# Patient Record
Sex: Male | Born: 1935 | Race: White | Hispanic: No | Marital: Married | Smoking: Never smoker
Health system: Southern US, Community
[De-identification: ages and names within clinical notes are randomized; demographics above are authoritative.]

## PROBLEM LIST (undated history)

## (undated) DIAGNOSIS — D496 Neoplasm of unspecified behavior of brain: Secondary | ICD-10-CM

## (undated) DIAGNOSIS — E785 Hyperlipidemia, unspecified: Secondary | ICD-10-CM

## (undated) DIAGNOSIS — G459 Transient cerebral ischemic attack, unspecified: Secondary | ICD-10-CM

## (undated) DIAGNOSIS — H269 Unspecified cataract: Secondary | ICD-10-CM

## (undated) DIAGNOSIS — C61 Malignant neoplasm of prostate: Secondary | ICD-10-CM

## (undated) DIAGNOSIS — I10 Essential (primary) hypertension: Secondary | ICD-10-CM

## (undated) DIAGNOSIS — Z889 Allergy status to unspecified drugs, medicaments and biological substances status: Secondary | ICD-10-CM

## (undated) DIAGNOSIS — N390 Urinary tract infection, site not specified: Secondary | ICD-10-CM

## (undated) DIAGNOSIS — R319 Hematuria, unspecified: Secondary | ICD-10-CM

## (undated) DIAGNOSIS — H9190 Unspecified hearing loss, unspecified ear: Secondary | ICD-10-CM

## (undated) HISTORY — PX: MELANOMA EXCISION: SHX5266

---

## 1997-09-30 ENCOUNTER — Other Ambulatory Visit: Admission: RE | Admit: 1997-09-30 | Discharge: 1997-09-30 | Payer: Self-pay | Admitting: Urology

## 1997-10-14 ENCOUNTER — Encounter: Admission: RE | Admit: 1997-10-14 | Discharge: 1998-01-12 | Payer: Self-pay | Admitting: Radiation Oncology

## 1998-10-24 ENCOUNTER — Ambulatory Visit: Admission: RE | Admit: 1998-10-24 | Discharge: 1998-10-24 | Payer: Self-pay | Admitting: *Deleted

## 2000-07-31 ENCOUNTER — Ambulatory Visit (HOSPITAL_COMMUNITY): Admission: RE | Admit: 2000-07-31 | Discharge: 2000-07-31 | Payer: Self-pay | Admitting: Gastroenterology

## 2001-06-13 ENCOUNTER — Ambulatory Visit (HOSPITAL_COMMUNITY): Admission: RE | Admit: 2001-06-13 | Discharge: 2001-06-13 | Payer: Self-pay | Admitting: *Deleted

## 2001-06-13 ENCOUNTER — Encounter: Payer: Self-pay | Admitting: *Deleted

## 2001-06-21 ENCOUNTER — Ambulatory Visit (HOSPITAL_COMMUNITY): Admission: RE | Admit: 2001-06-21 | Discharge: 2001-06-21 | Payer: Self-pay | Admitting: *Deleted

## 2001-06-21 ENCOUNTER — Encounter: Payer: Self-pay | Admitting: *Deleted

## 2005-06-04 ENCOUNTER — Ambulatory Visit: Payer: Self-pay | Admitting: Gastroenterology

## 2005-06-27 ENCOUNTER — Ambulatory Visit: Payer: Self-pay | Admitting: Gastroenterology

## 2005-07-10 ENCOUNTER — Ambulatory Visit (HOSPITAL_COMMUNITY): Admission: RE | Admit: 2005-07-10 | Discharge: 2005-07-10 | Payer: Self-pay | Admitting: Gastroenterology

## 2005-07-10 ENCOUNTER — Ambulatory Visit: Payer: Self-pay | Admitting: Oncology

## 2005-07-10 ENCOUNTER — Ambulatory Visit: Payer: Self-pay | Admitting: Gastroenterology

## 2005-07-12 ENCOUNTER — Ambulatory Visit (HOSPITAL_COMMUNITY): Admission: RE | Admit: 2005-07-12 | Discharge: 2005-07-12 | Payer: Self-pay | Admitting: General Surgery

## 2005-07-23 ENCOUNTER — Ambulatory Visit (HOSPITAL_COMMUNITY): Admission: RE | Admit: 2005-07-23 | Discharge: 2005-07-24 | Payer: Self-pay | Admitting: General Surgery

## 2005-07-23 ENCOUNTER — Encounter (INDEPENDENT_AMBULATORY_CARE_PROVIDER_SITE_OTHER): Payer: Self-pay | Admitting: *Deleted

## 2005-12-10 ENCOUNTER — Ambulatory Visit: Payer: Self-pay | Admitting: Oncology

## 2005-12-12 LAB — CBC WITH DIFFERENTIAL/PLATELET
BASO%: 0.6 % (ref 0.0–2.0)
Eosinophils Absolute: 0.6 10*3/uL — ABNORMAL HIGH (ref 0.0–0.5)
LYMPH%: 29.5 % (ref 14.0–48.0)
MCHC: 34.1 g/dL (ref 32.0–35.9)
MCV: 92.9 fL (ref 81.6–98.0)
MONO%: 12 % (ref 0.0–13.0)
Platelets: 338 10*3/uL (ref 145–400)
RBC: 4.91 10*6/uL (ref 4.20–5.71)

## 2005-12-12 LAB — COMPREHENSIVE METABOLIC PANEL
Alkaline Phosphatase: 47 U/L (ref 39–117)
Creatinine, Ser: 1.1 mg/dL (ref 0.40–1.50)
Glucose, Bld: 98 mg/dL (ref 70–99)
Sodium: 137 mEq/L (ref 135–145)
Total Bilirubin: 0.6 mg/dL (ref 0.3–1.2)
Total Protein: 7.5 g/dL (ref 6.0–8.3)

## 2006-04-22 ENCOUNTER — Ambulatory Visit: Payer: Self-pay | Admitting: Oncology

## 2006-04-24 LAB — CBC WITH DIFFERENTIAL/PLATELET
BASO%: 0.8 % (ref 0.0–2.0)
HCT: 44.3 % (ref 38.7–49.9)
LYMPH%: 32.2 % (ref 14.0–48.0)
MCHC: 34.5 g/dL (ref 32.0–35.9)
MCV: 93.2 fL (ref 81.6–98.0)
MONO#: 0.7 10*3/uL (ref 0.1–0.9)
MONO%: 11.1 % (ref 0.0–13.0)
NEUT%: 46.2 % (ref 40.0–75.0)
Platelets: 320 10*3/uL (ref 145–400)
RBC: 4.74 10*6/uL (ref 4.20–5.71)
WBC: 6.5 10*3/uL (ref 4.0–10.0)

## 2006-04-24 LAB — COMPREHENSIVE METABOLIC PANEL
AST: 21 U/L (ref 0–37)
Albumin: 4.5 g/dL (ref 3.5–5.2)
Alkaline Phosphatase: 45 U/L (ref 39–117)
Potassium: 4.2 mEq/L (ref 3.5–5.3)
Sodium: 140 mEq/L (ref 135–145)
Total Protein: 7.3 g/dL (ref 6.0–8.3)

## 2006-10-15 ENCOUNTER — Ambulatory Visit: Payer: Self-pay | Admitting: Oncology

## 2006-10-17 LAB — CBC WITH DIFFERENTIAL/PLATELET
BASO%: 0.7 % (ref 0.0–2.0)
Basophils Absolute: 0 10*3/uL (ref 0.0–0.1)
EOS%: 9.2 % — ABNORMAL HIGH (ref 0.0–7.0)
HCT: 42.9 % (ref 38.7–49.9)
HGB: 15 g/dL (ref 13.0–17.1)
LYMPH%: 30.2 % (ref 14.0–48.0)
MCH: 32.1 pg (ref 28.0–33.4)
MCHC: 35 g/dL (ref 32.0–35.9)
MCV: 91.7 fL (ref 81.6–98.0)
MONO%: 11.7 % (ref 0.0–13.0)
NEUT%: 48.2 % (ref 40.0–75.0)

## 2006-10-17 LAB — COMPREHENSIVE METABOLIC PANEL
AST: 21 U/L (ref 0–37)
Alkaline Phosphatase: 50 U/L (ref 39–117)
BUN: 14 mg/dL (ref 6–23)
Calcium: 9.1 mg/dL (ref 8.4–10.5)
Creatinine, Ser: 0.92 mg/dL (ref 0.40–1.50)
Total Bilirubin: 0.8 mg/dL (ref 0.3–1.2)

## 2007-04-11 ENCOUNTER — Ambulatory Visit: Payer: Self-pay | Admitting: Oncology

## 2007-04-15 LAB — CBC WITH DIFFERENTIAL/PLATELET
Basophils Absolute: 0 10*3/uL (ref 0.0–0.1)
Eosinophils Absolute: 0.8 10*3/uL — ABNORMAL HIGH (ref 0.0–0.5)
HCT: 43.3 % (ref 38.7–49.9)
HGB: 15.1 g/dL (ref 13.0–17.1)
MCV: 92.1 fL (ref 81.6–98.0)
MONO%: 11.4 % (ref 0.0–13.0)
NEUT#: 4.5 10*3/uL (ref 1.5–6.5)
NEUT%: 50.4 % (ref 40.0–75.0)
Platelets: 289 10*3/uL (ref 145–400)
RDW: 12.9 % (ref 11.2–14.6)

## 2007-04-15 LAB — COMPREHENSIVE METABOLIC PANEL
Albumin: 4.5 g/dL (ref 3.5–5.2)
Alkaline Phosphatase: 50 U/L (ref 39–117)
BUN: 15 mg/dL (ref 6–23)
Calcium: 9.7 mg/dL (ref 8.4–10.5)
Chloride: 107 mEq/L (ref 96–112)
Glucose, Bld: 100 mg/dL — ABNORMAL HIGH (ref 70–99)
Potassium: 4 mEq/L (ref 3.5–5.3)

## 2007-10-07 ENCOUNTER — Ambulatory Visit (HOSPITAL_COMMUNITY): Admission: RE | Admit: 2007-10-07 | Discharge: 2007-10-07 | Payer: Self-pay | Admitting: Oncology

## 2007-10-09 ENCOUNTER — Ambulatory Visit: Payer: Self-pay | Admitting: Oncology

## 2007-10-14 LAB — COMPREHENSIVE METABOLIC PANEL
ALT: 17 U/L (ref 0–53)
AST: 20 U/L (ref 0–37)
Albumin: 3.8 g/dL (ref 3.5–5.2)
Alkaline Phosphatase: 44 U/L (ref 39–117)
Potassium: 3.9 mEq/L (ref 3.5–5.3)
Sodium: 142 mEq/L (ref 135–145)
Total Bilirubin: 0.5 mg/dL (ref 0.3–1.2)
Total Protein: 6.7 g/dL (ref 6.0–8.3)

## 2007-10-14 LAB — CBC WITH DIFFERENTIAL/PLATELET
BASO%: 0.3 % (ref 0.0–2.0)
EOS%: 9.2 % — ABNORMAL HIGH (ref 0.0–7.0)
Eosinophils Absolute: 0.7 10*3/uL — ABNORMAL HIGH (ref 0.0–0.5)
LYMPH%: 20.2 % (ref 14.0–48.0)
MCHC: 35 g/dL (ref 32.0–35.9)
MCV: 91.5 fL (ref 81.6–98.0)
MONO%: 11.8 % (ref 0.0–13.0)
NEUT#: 4.1 10*3/uL (ref 1.5–6.5)
Platelets: 263 10*3/uL (ref 145–400)
RBC: 4.56 10*6/uL (ref 4.20–5.71)
RDW: 12.9 % (ref 11.2–14.6)

## 2008-04-12 ENCOUNTER — Ambulatory Visit: Payer: Self-pay | Admitting: Oncology

## 2008-05-13 LAB — COMPREHENSIVE METABOLIC PANEL
AST: 19 U/L (ref 0–37)
Albumin: 4 g/dL (ref 3.5–5.2)
Alkaline Phosphatase: 57 U/L (ref 39–117)
BUN: 12 mg/dL (ref 6–23)
Calcium: 9.6 mg/dL (ref 8.4–10.5)
Chloride: 108 mEq/L (ref 96–112)
Glucose, Bld: 154 mg/dL — ABNORMAL HIGH (ref 70–99)
Potassium: 4.1 mEq/L (ref 3.5–5.3)
Sodium: 141 mEq/L (ref 135–145)
Total Protein: 7.1 g/dL (ref 6.0–8.3)

## 2008-05-13 LAB — CBC WITH DIFFERENTIAL/PLATELET
Basophils Absolute: 0 10*3/uL (ref 0.0–0.1)
EOS%: 6.6 % (ref 0.0–7.0)
Eosinophils Absolute: 0.6 10*3/uL — ABNORMAL HIGH (ref 0.0–0.5)
HGB: 15.1 g/dL (ref 13.0–17.1)
NEUT#: 5.5 10*3/uL (ref 1.5–6.5)
RBC: 4.74 10*6/uL (ref 4.20–5.71)
RDW: 12.9 % (ref 11.2–14.6)
lymph#: 1.9 10*3/uL (ref 0.9–3.3)

## 2008-11-09 ENCOUNTER — Ambulatory Visit: Payer: Self-pay | Admitting: Oncology

## 2008-11-30 ENCOUNTER — Ambulatory Visit (HOSPITAL_COMMUNITY): Admission: RE | Admit: 2008-11-30 | Discharge: 2008-11-30 | Payer: Self-pay | Admitting: Oncology

## 2008-11-30 LAB — CBC WITH DIFFERENTIAL/PLATELET
Basophils Absolute: 0.1 10*3/uL (ref 0.0–0.1)
Eosinophils Absolute: 0.8 10*3/uL — ABNORMAL HIGH (ref 0.0–0.5)
HCT: 43.9 % (ref 38.4–49.9)
HGB: 15.2 g/dL (ref 13.0–17.1)
MCH: 32.3 pg (ref 27.2–33.4)
MONO#: 0.7 10*3/uL (ref 0.1–0.9)
NEUT#: 4.3 10*3/uL (ref 1.5–6.5)
NEUT%: 54.1 % (ref 39.0–75.0)
WBC: 8 10*3/uL (ref 4.0–10.3)
lymph#: 2.2 10*3/uL (ref 0.9–3.3)

## 2008-11-30 LAB — COMPREHENSIVE METABOLIC PANEL
Albumin: 4.1 g/dL (ref 3.5–5.2)
BUN: 16 mg/dL (ref 6–23)
CO2: 23 mEq/L (ref 19–32)
Calcium: 9.5 mg/dL (ref 8.4–10.5)
Chloride: 107 mEq/L (ref 96–112)
Creatinine, Ser: 1.02 mg/dL (ref 0.40–1.50)
Glucose, Bld: 104 mg/dL — ABNORMAL HIGH (ref 70–99)

## 2008-11-30 LAB — LACTATE DEHYDROGENASE: LDH: 165 U/L (ref 94–250)

## 2008-12-08 ENCOUNTER — Ambulatory Visit (HOSPITAL_COMMUNITY): Admission: RE | Admit: 2008-12-08 | Discharge: 2008-12-08 | Payer: Self-pay | Admitting: Oncology

## 2009-04-01 ENCOUNTER — Ambulatory Visit: Payer: Self-pay | Admitting: Oncology

## 2009-04-05 LAB — CBC WITH DIFFERENTIAL/PLATELET
BASO%: 1.2 % (ref 0.0–2.0)
EOS%: 11.3 % — ABNORMAL HIGH (ref 0.0–7.0)
HCT: 44.3 % (ref 38.4–49.9)
LYMPH%: 23.7 % (ref 14.0–49.0)
MCH: 31.4 pg (ref 27.2–33.4)
MCHC: 33.2 g/dL (ref 32.0–36.0)
MONO#: 0.5 10*3/uL (ref 0.1–0.9)
NEUT%: 55.8 % (ref 39.0–75.0)
Platelets: 247 10*3/uL (ref 140–400)

## 2009-04-05 LAB — COMPREHENSIVE METABOLIC PANEL
AST: 18 U/L (ref 0–37)
BUN: 13 mg/dL (ref 6–23)
Calcium: 9 mg/dL (ref 8.4–10.5)
Chloride: 109 mEq/L (ref 96–112)
Creatinine, Ser: 1.02 mg/dL (ref 0.40–1.50)

## 2009-09-30 ENCOUNTER — Ambulatory Visit: Payer: Self-pay | Admitting: Oncology

## 2009-10-04 LAB — CBC WITH DIFFERENTIAL/PLATELET
Eosinophils Absolute: 1.1 10*3/uL — ABNORMAL HIGH (ref 0.0–0.5)
HGB: 15.1 g/dL (ref 13.0–17.1)
MCV: 93.8 fL (ref 79.3–98.0)
MONO%: 9.5 % (ref 0.0–14.0)
NEUT#: 4.8 10*3/uL (ref 1.5–6.5)
RBC: 4.8 10*6/uL (ref 4.20–5.82)
RDW: 13.1 % (ref 11.0–14.6)
WBC: 9.6 10*3/uL (ref 4.0–10.3)
lymph#: 2.7 10*3/uL (ref 0.9–3.3)

## 2009-10-04 LAB — COMPREHENSIVE METABOLIC PANEL
AST: 25 U/L (ref 0–37)
Albumin: 3.8 g/dL (ref 3.5–5.2)
Alkaline Phosphatase: 57 U/L (ref 39–117)
Calcium: 9.2 mg/dL (ref 8.4–10.5)
Chloride: 104 mEq/L (ref 96–112)
Glucose, Bld: 100 mg/dL — ABNORMAL HIGH (ref 70–99)
Potassium: 4.4 mEq/L (ref 3.5–5.3)
Sodium: 140 mEq/L (ref 135–145)
Total Protein: 6.7 g/dL (ref 6.0–8.3)

## 2010-04-04 ENCOUNTER — Ambulatory Visit: Payer: Self-pay | Admitting: Oncology

## 2010-04-06 LAB — COMPREHENSIVE METABOLIC PANEL
Albumin: 3.7 g/dL (ref 3.5–5.2)
BUN: 13 mg/dL (ref 6–23)
CO2: 22 mEq/L (ref 19–32)
Calcium: 9.1 mg/dL (ref 8.4–10.5)
Chloride: 107 mEq/L (ref 96–112)
Creatinine, Ser: 1.06 mg/dL (ref 0.40–1.50)
Glucose, Bld: 101 mg/dL — ABNORMAL HIGH (ref 70–99)
Potassium: 4 mEq/L (ref 3.5–5.3)

## 2010-04-06 LAB — CBC WITH DIFFERENTIAL/PLATELET
Basophils Absolute: 0 10*3/uL (ref 0.0–0.1)
Eosinophils Absolute: 0.7 10*3/uL — ABNORMAL HIGH (ref 0.0–0.5)
HCT: 42.4 % (ref 38.4–49.9)
HGB: 14.8 g/dL (ref 13.0–17.1)
NEUT#: 4.8 10*3/uL (ref 1.5–6.5)
NEUT%: 57.5 % (ref 39.0–75.0)
RDW: 12.9 % (ref 11.0–14.6)
lymph#: 2.1 10*3/uL (ref 0.9–3.3)

## 2010-04-06 LAB — LACTATE DEHYDROGENASE: LDH: 171 U/L (ref 94–250)

## 2010-07-23 ENCOUNTER — Encounter: Payer: Self-pay | Admitting: Dermatology

## 2010-10-05 ENCOUNTER — Other Ambulatory Visit: Payer: Self-pay | Admitting: Oncology

## 2010-10-05 ENCOUNTER — Encounter (HOSPITAL_BASED_OUTPATIENT_CLINIC_OR_DEPARTMENT_OTHER): Payer: Medicare Other | Admitting: Oncology

## 2010-10-05 ENCOUNTER — Ambulatory Visit (HOSPITAL_COMMUNITY)
Admission: RE | Admit: 2010-10-05 | Discharge: 2010-10-05 | Disposition: A | Discharge status: Home / Self Care | Payer: Medicare Other | Source: Ambulatory Visit | Attending: Oncology | Admitting: Oncology

## 2010-10-05 DIAGNOSIS — C439 Malignant melanoma of skin, unspecified: Secondary | ICD-10-CM

## 2010-10-05 DIAGNOSIS — C4359 Malignant melanoma of other part of trunk: Secondary | ICD-10-CM

## 2010-10-05 DIAGNOSIS — I10 Essential (primary) hypertension: Secondary | ICD-10-CM | POA: Insufficient documentation

## 2010-10-05 DIAGNOSIS — K8020 Calculus of gallbladder without cholecystitis without obstruction: Secondary | ICD-10-CM | POA: Insufficient documentation

## 2010-10-05 DIAGNOSIS — Z85820 Personal history of malignant melanoma of skin: Secondary | ICD-10-CM | POA: Insufficient documentation

## 2010-10-05 DIAGNOSIS — Z8546 Personal history of malignant neoplasm of prostate: Secondary | ICD-10-CM

## 2010-10-05 LAB — CBC WITH DIFFERENTIAL/PLATELET
BASO%: 0.9 % (ref 0.0–2.0)
LYMPH%: 30.8 % (ref 14.0–49.0)
MCHC: 34.1 g/dL (ref 32.0–36.0)
MCV: 91 fL (ref 79.3–98.0)
MONO%: 10.4 % (ref 0.0–14.0)
Platelets: 276 10*3/uL (ref 140–400)
RBC: 4.87 10*6/uL (ref 4.20–5.82)
WBC: 9 10*3/uL (ref 4.0–10.3)

## 2010-10-05 LAB — COMPREHENSIVE METABOLIC PANEL
ALT: 18 U/L (ref 0–53)
AST: 20 U/L (ref 0–37)
Alkaline Phosphatase: 53 U/L (ref 39–117)
Sodium: 140 mEq/L (ref 135–145)
Total Bilirubin: 0.4 mg/dL (ref 0.3–1.2)
Total Protein: 6.6 g/dL (ref 6.0–8.3)

## 2010-10-06 ENCOUNTER — Encounter (HOSPITAL_BASED_OUTPATIENT_CLINIC_OR_DEPARTMENT_OTHER): Payer: Medicare Other | Admitting: Oncology

## 2010-10-06 DIAGNOSIS — C4359 Malignant melanoma of other part of trunk: Secondary | ICD-10-CM

## 2010-10-06 DIAGNOSIS — Z8546 Personal history of malignant neoplasm of prostate: Secondary | ICD-10-CM

## 2010-11-17 NOTE — Op Note (Signed)
NAME:  Jesse Owen, Jesse Owen NO.:  [id number]   MEDICAL RECORD NO.:  [id number]          PATIENT TYPE:  OIB   LOCATION:  5711                         FACILITY:  [facility]   PHYSICIAN:  Gabrielle Dare. Janee [name], M.D.DATE OF BIRTH:  Apr 04, 1930   DATE OF PROCEDURE:  07/23/2005  DATE OF DISCHARGE:                                 OPERATIVE REPORT   PREOPERATIVE DIAGNOSIS:  Melanoma of the right central back.   POSTOPERATIVE DIAGNOSIS:  Melanoma of the right central back.   OPERATION PERFORMED:  1.  Left axillary sentinel lymph node biopsy x4.  2.  Wide excision of melanoma, right back, 15 x 7 cm.   COSURGEONS:  1.  Gabrielle Dare. Janee [name], M.D.  2.  Marcial Pacas E. Earlene Plater, M.D.   ANESTHESIA:  General.   INDICATIONS FOR PROCEDURE:  The patient is a 77 year old white male who was  evaluated in our office by Marcial Pacas E. Earlene Plater, M.D. in regard to a melanoma on  his right back.  Biopsy revealed it to be in the thick range of 5 mm total  thickness.  Lymph scintigraphy was obtained preoperatively which  demonstrated some scattered uptake but the most focused uptake was in the  posterior aspect of his axillary node base and he presents for left axillary  sentinel lymph node biopsy and wide excision of melanoma of his back.   DESCRIPTION OF PROCEDURE:  Informed consent was obtained.  The patient was  identified in the preoperative holding area.  His site was marked. He  received intravenous antibiotics and informed consent was obtained and he  was brought to the operating room. General anesthesia was administered.  Subsequently, the lesion on his back was prepped with alcohol wipes and  intradermal injection of 4 mL of methylene blue was accomplished without  difficulty.  That was injected in four quadrants around the lesion on his  back. That was massaged for four minutes by the clock.  He was returned to  his back and his left axilla was prepped and draped in sterile fashion.  We  then used a  NeoProbe for guidance and while the signal was not exceedingly  strong, the high signal was located in the mid to posterior portion of his  left axilla.  A transverse incision was made.  Subcutaneous tissues were  dissected down.  The axillary fat was entered.  Then using the NeoProbe as  guidance, an area of lymph tissue was identified with some streaky blue  lymphatics that was mildly active with radioactivity.  It was excised as the  first sentinel node.  There was some further signal down in the area and two  further small clusters of nodes with some small blue streaks were removed.  We stayed away from the long thoracic and thoracodorsal nerves.  They were  identified and avoided.  The NeoProbe signal although moderate, was not  exceedingly high in any of these nodal tissues so one further area of signal  was identified and this was removed, staying clear of the nerves'  structures.  Baby clips were used on the small veins and lymphatics with all  of  the samplings and the fourth sentinel lymph node area was sent to  pathology.  No other significant signal was found with the NeoProbe.  The  wound was copiously irrigated.  Meticulous hemostasis was ensured. It was  then closed in two layers and the subcutaneous tissues approximated with 3-0  Vicryl suture in an interrupted.  The skin was closed with a running 4-0  Monocryl subcuticular stitch.  Benzoin and Steri-Strips and sterile dressing  were applied.  We then changed gowns and instruments.  The patient was  flipped into prone position.  Measurements around his lesion  and  examination of his skin seemed consistent with a good ability to get a  greater than 3 cm margin and achieve closure.  His back was prepped and  draped in sterile fashion.  Some 0.25% Marcaine with epinephrine was  injected with local anesthetic.  A long elliptical incision was measured in  order to give a 3 cm or greater margin circumferentially around the  lesion.  It was slanted from the left upper down to the right lower orientation.  This ellipse was incised with the scalpel.  It measured 15 x 7 cm and  subcutaneous tissues were dissected down with Bovie cautery getting  excellent hemostasis and this ellipse of skin was dissected off the fascia  of the muscles of the back. Hemostasis was achieved along the way with Bovie  cautery and the specimen was excised in one piece, was oriented with  stitches for pathology and sent for permanent section.  The wound was  copiously irrigated.  Hemostasis was ensured in the wound bed.  Some small  flaps were raised along the fascia superiorly and inferiorly.  The wound was  again irrigated.  Hemostasis was ensured.  The wound was closed in two  layers, deep layer of subcutaneous tissue was approximated with interrupted  2-0 Vicryl sutures achieving a nice tension free closure and the skin was  closed with interrupted 2-0 nylon sutures.  Sponge, needle and instrument  counts were all correct.  A sterile dressing was applied.  The patient  tolerated the procedure well without apparent complication and was taken to  the recovery room in stable condition.      Gabrielle Dare Janee [name], M.D.  Electronically Signed     BET/MEDQ  D:  07/23/2005  T:  07/24/2005  Job:  [id number]   cc:   Dr. Elmon Else, [facility]   Elana Alm. Nicholos Johns, M.D.  Fax: [phone]

## 2010-11-17 NOTE — Discharge Summary (Signed)
NAME:  Jesse Owen, TALLMAN NO.:  [id number]   MEDICAL RECORD NO.:  [id number]          PATIENT TYPE:  OIB   LOCATION:  5711                         FACILITY:  [facility]   PHYSICIAN:  Gabrielle Dare. Janee [name], M.D.DATE OF BIRTH:  20-Sep-1929   DATE OF ADMISSION:  07/23/2005  DATE OF DISCHARGE:  07/24/2005                                 DISCHARGE SUMMARY   DISCHARGE DIAGNOSES:  1.  Melanoma of the right central back.  2.  Status post wide excision melanoma right central back and left axillary      sentinel lymph node biopsy   HISTORY OF PRESENT ILLNESS:  The patient 77 year old male with a melanoma on  his back who was worked up extensively and presents for elective wide  excision of left axillary sentinel lymph node biopsy.   HOSPITAL COURSE:  The patient underwent left axillary sentinel lymph node  biopsies and wide excision of melanoma on his right central back on the day  of admission. He tolerated procedure well without significant complications  postoperatively. He has remained afebrile and hemodynamically stable. He has  required no pain medication and is up ambulating this morning and tolerating  regular diet. He is discharged home in stable condition.   DISCHARGE DIET:  Regular.   DISCHARGE ACTIVITY:  No lifting.   DISCHARGE MEDICATIONS:  Include Percocet 5/325 one to two p.o. q.6 h. p.r.n.  pain.   In addition he is to continue his home medications as follows:  1.  Simvastatin 80 mg p.o. nightly.  2.  Losartan 100 mg p.o. q.a.m.  3.  Clopidogrel 75 mg p.o. q.p.m.  4.  Niacin 500 mg p.o. at bedtime.  5.  Flunisolide spray in each naris q.a.m.  6.  Enteric-coated aspirin 81 mg p.o. q.p.m.  7.  Osteobiflex one each noon.   FOLLOW UP:  With myself in 2 weeks.      Gabrielle Dare Janee [name], M.D.  Electronically Signed     BET/MEDQ  D:  07/24/2005  T:  07/24/2005  Job:  [id number]

## 2011-05-22 ENCOUNTER — Encounter: Payer: Self-pay | Admitting: Emergency Medicine

## 2011-05-22 ENCOUNTER — Emergency Department (INDEPENDENT_AMBULATORY_CARE_PROVIDER_SITE_OTHER): Payer: Medicare Other

## 2011-05-22 ENCOUNTER — Emergency Department (HOSPITAL_BASED_OUTPATIENT_CLINIC_OR_DEPARTMENT_OTHER)
Admission: EM | Admit: 2011-05-22 | Discharge: 2011-05-22 | Disposition: A | Discharge status: Home / Self Care | Payer: Medicare Other | Attending: Emergency Medicine | Admitting: Emergency Medicine

## 2011-05-22 DIAGNOSIS — E785 Hyperlipidemia, unspecified: Secondary | ICD-10-CM | POA: Insufficient documentation

## 2011-05-22 DIAGNOSIS — Z8679 Personal history of other diseases of the circulatory system: Secondary | ICD-10-CM | POA: Insufficient documentation

## 2011-05-22 DIAGNOSIS — R05 Cough: Secondary | ICD-10-CM

## 2011-05-22 DIAGNOSIS — R059 Cough, unspecified: Secondary | ICD-10-CM | POA: Insufficient documentation

## 2011-05-22 DIAGNOSIS — I10 Essential (primary) hypertension: Secondary | ICD-10-CM | POA: Insufficient documentation

## 2011-05-22 DIAGNOSIS — J40 Bronchitis, not specified as acute or chronic: Secondary | ICD-10-CM | POA: Insufficient documentation

## 2011-05-22 HISTORY — DX: Transient cerebral ischemic attack, unspecified: G45.9

## 2011-05-22 HISTORY — DX: Essential (primary) hypertension: I10

## 2011-05-22 HISTORY — DX: Malignant neoplasm of prostate: C61

## 2011-05-22 HISTORY — DX: Hyperlipidemia, unspecified: E78.5

## 2011-05-22 MED ORDER — ALBUTEROL SULFATE HFA 108 (90 BASE) MCG/ACT IN AERS
2.0000 | INHALATION_SPRAY | RESPIRATORY_TRACT | Status: DC | PRN
Start: 2011-05-22 — End: 2011-05-22
  Administered 2011-05-22: 2 via RESPIRATORY_TRACT
  Filled 2011-05-22: qty 6.7

## 2011-05-22 MED ORDER — DOXYCYCLINE HYCLATE 100 MG PO CAPS: 100.0000 mg | ORAL_CAPSULE | Freq: Two times a day (BID) | ORAL | Status: AC

## 2011-05-22 NOTE — ED Provider Notes (Signed)
History     CSN: [id number] Arrival date & time: 05/22/2011  4:42 AM   First MD Initiated Contact with Patient 05/22/11 0450      Chief Complaint  Patient presents with  . Cough    Patient is a 77 y.o. male presenting with cough. The history is provided by the patient and the spouse.  Cough This is a new problem. The current episode started more than 2 days ago. The problem occurs hourly. The problem has been gradually worsening. The cough is productive of sputum. There has been no fever. Pertinent negatives include no chest pain, no chills, no sweats, no sore throat and no shortness of breath. He has tried cough syrup for the symptoms. He is not a smoker.  pt denies cp/sob Denies orthopnea Denies dyspnea on exertion Denies LE edema Denies hemoptysis Denies back pain Denies abd pain No vomiting is reported  Past Medical History  Diagnosis Date  . Hypertension   . Hyperlipemia   . TIA (transient ischemic attack)   . Prostate cancer     Past Surgical History  Procedure Date  . Melanoma excision     No family history on file.  History  Substance Use Topics  . Smoking status: Never Smoker   . Smokeless tobacco: Not on file  . Alcohol Use: No      Review of Systems  Constitutional: Negative for chills.  HENT: Negative for sore throat.   Respiratory: Positive for cough. Negative for shortness of breath.   Cardiovascular: Negative for chest pain.  All other systems reviewed and are negative.    Allergies  Review of patient's allergies indicates no known allergies.  Home Medications   Current Outpatient Rx  Name Route Sig Dispense Refill  . CLOPIDOGREL BISULFATE 75 MG PO TABS Oral Take 75 mg by mouth daily.      Marland Kitchen FINASTERIDE 5 MG PO TABS Oral Take 5 mg by mouth daily.      Marland Kitchen FLUNISOLIDE 29 MCG/ACT (0.025%) NA SOLN Nasal Place 2 sprays into the nose as needed. Dose is for each nostril.     Marland Kitchen LORATADINE 10 MG PO TABS Oral Take 10 mg by mouth daily.      Marland Kitchen  LOSARTAN POTASSIUM 100 MG PO TABS Oral Take 100 mg by mouth daily.      . MULTIVITAMINS PO CAPS Oral Take 1 capsule by mouth daily.      Marland Kitchen SIMVASTATIN 80 MG PO TABS Oral Take 80 mg by mouth at bedtime.        BP 170/84  Pulse 86  Temp(Src) 99 F (37.2 C) (Oral)  Resp 18  Wt 190 lb (86.183 kg)  SpO2 98%  Physical Exam  CONSTITUTIONAL: Well developed/well nourished HEAD AND FACE: Normocephalic/atraumatic EYES: EOMI/PERRL ENMT: Mucous membranes moist, nasal congestion NECK: supple no meningeal signs CV: S1/S2 noted, soft murmur noted (chronic per patient) LUNGS: Lungs are clear to auscultation bilaterally, no apparent distress ABDOMEN: soft, nontender, no rebound or guarding NEURO: Pt is awake/alert, moves all extremitiesx4 EXTREMITIES: pulses normal, full ROM, no significant LE edema noted SKIN: warm, color normal PSYCH: no abnormalities of mood noted   ED Course  Procedures    4:59 AM Pt well appearing, no distress Will get CXR and reassess  5:41 AM Xray reviewed, ?early infiltrate Pt is well appearing, no distress.  I will start an antibiotic as he is very safe for d/c home.  We discussed strict return precautions.  Pt/family agreeable  MDM  Nursing notes reviewed and considered in documentation xrays reviewed and considered         Joya Gaskins, MD 05/22/11 [phone]

## 2011-05-22 NOTE — ED Notes (Signed)
Pt reports productive cough x1 week.

## 2011-09-27 ENCOUNTER — Telehealth: Payer: Self-pay | Admitting: Oncology

## 2011-09-27 NOTE — Telephone Encounter (Signed)
called pt and provided appts for 04/05-04/12

## 2011-10-05 ENCOUNTER — Other Ambulatory Visit: Payer: Medicare Other | Admitting: Lab

## 2011-10-05 ENCOUNTER — Ambulatory Visit (HOSPITAL_COMMUNITY)
Admission: RE | Admit: 2011-10-05 | Discharge: 2011-10-05 | Disposition: A | Discharge status: Home / Self Care | Payer: Medicare Other | Source: Ambulatory Visit | Attending: Oncology | Admitting: Oncology

## 2011-10-05 ENCOUNTER — Other Ambulatory Visit: Payer: Self-pay | Admitting: Oncology

## 2011-10-05 DIAGNOSIS — Z85820 Personal history of malignant melanoma of skin: Secondary | ICD-10-CM | POA: Insufficient documentation

## 2011-10-05 DIAGNOSIS — R0602 Shortness of breath: Secondary | ICD-10-CM | POA: Insufficient documentation

## 2011-10-12 ENCOUNTER — Telehealth: Payer: Self-pay | Admitting: Oncology

## 2011-10-12 ENCOUNTER — Ambulatory Visit (HOSPITAL_BASED_OUTPATIENT_CLINIC_OR_DEPARTMENT_OTHER): Payer: Medicare Other | Admitting: Oncology

## 2011-10-12 VITALS — BP 200/100 | HR 95 | Temp 97.5°F | Ht 66.0 in | Wt 194.0 lb

## 2011-10-12 DIAGNOSIS — C439 Malignant melanoma of skin, unspecified: Secondary | ICD-10-CM

## 2011-10-12 DIAGNOSIS — C61 Malignant neoplasm of prostate: Secondary | ICD-10-CM

## 2011-10-12 DIAGNOSIS — C4360 Malignant melanoma of unspecified upper limb, including shoulder: Secondary | ICD-10-CM

## 2011-10-12 NOTE — Telephone Encounter (Signed)
appt made and printed for pt aom °

## 2011-10-12 NOTE — Progress Notes (Signed)
Hematology and Oncology Follow Up Visit  Jesse Owen [id number] February 06, 1930 78 y.o. 10/12/2011 10:50 AM  Jesse Else, MD  Jesse Blackbird, MD  Principle Diagnosis: This is a 78 year old gentleman with the following diagnoses: 1.  Superficial spreading melanoma diagnosed in January 2007.  He has a Breslow's measurement of 4 mm and Clark's level of 4. 2.  History of prostate cancer, status post external beam radiation around year 2000, followed by Dr. Aldean Ast.   Prior Therapy: The patient is status post wide excision for his superficial spreading melanoma.  Again on July 23, 2005, had a wide excision and sentinel lymph node sampling that was negative at that time.  His initial pathological staging was T4a lesion.  Current therapy: Observation and surveillance.  Interim History:  [name] presents today for a follow-up visit.  A very pleasant gentleman diagnosed with superficial spreading melanoma in 2007, on his left shoulder.  He is status post wide excision sentinel lymph node sampling, refused adjuvant therapy at that time.  He has been on active surveillance now for the last five years.  He has not really had any evidence to suggest recurrent disease.  He has not reported any constitutional symptoms.  He has not reported any major changes in his performance status or activity level.  He has not reported any abdominal discomfort, pain or shortness of breath.  He has not reported any genitourinary complaints.  Again, he has not had any breathing difficulty.  He has not had any fluid retention or fluid overload.  For the most part, activity of daily living remained maintained at this point.  Medications: I have reviewed the patient's current medications. Current outpatient prescriptions:clopidogrel (PLAVIX) 75 MG tablet, Take 75 mg by mouth daily.  , Disp: , Rfl: ;  finasteride (PROSCAR) 5 MG tablet, Take 5 mg by mouth daily.  , Disp: , Rfl: ;  flunisolide (NASAREL) 29 MCG/ACT  (0.025%) nasal spray, Place 2 sprays into the nose as needed. Dose is for each nostril. , Disp: , Rfl: ;  loratadine (CLARITIN) 10 MG tablet, Take 10 mg by mouth daily.  , Disp: , Rfl:  losartan (COZAAR) 100 MG tablet, Take 100 mg by mouth daily.  , Disp: , Rfl: ;  Multiple Vitamin (MULTIVITAMIN) capsule, Take 1 capsule by mouth daily.  , Disp: , Rfl: ;  simvastatin (ZOCOR) 80 MG tablet, Take 80 mg by mouth at bedtime.  , Disp: , Rfl:   Allergies: No Known Allergies  Past Medical History, Surgical history, Social history, and Family History were reviewed and updated.  Review of Systems: Constitutional:  Negative for fever, chills, night sweats, anorexia, weight loss, pain. Cardiovascular: no chest pain or dyspnea on exertion Respiratory: negative Neurological: no TIA or stroke symptoms Dermatological: negative ENT: negative Skin: Negative. Gastrointestinal: negative Genito-Urinary: negative Hematological and Lymphatic: negative Breast: negative Musculoskeletal: negative Remaining ROS negative. Physical Exam: Blood pressure 200/100, pulse 95, temperature 97.5 F (36.4 C), temperature source Oral, height 5\' 6"  (1.676 m), weight 194 lb (87.998 kg). ECOG: 1 General appearance: alert Head: Normocephalic, without obvious abnormality, atraumatic Neck: no adenopathy, no carotid bruit, no JVD, supple, symmetrical, trachea midline and thyroid not enlarged, symmetric, no tenderness/mass/nodules Lymph nodes: Cervical, supraclavicular, and axillary nodes normal. Heart:regular rate and rhythm, S1, S2 normal, no murmur, click, rub or gallop Lung:chest clear, no wheezing, rales, normal symmetric air entry Abdomin: soft, non-tender, without masses or organomegaly EXT:no erythema, induration, or nodules Skin: No lesions noted.    Lab Results:  Lab Results  Component Value Date   WBC 9.0 10/05/2010   HGB 15.1 10/05/2010   HCT 44.3 10/05/2010   MCV 91.0 10/05/2010   PLT 276 10/05/2010     Chemistry       Component Value Date/Time   NA 140 10/05/2010 1009   NA 140 10/05/2010 1009   K 4.1 10/05/2010 1009   K 4.1 10/05/2010 1009   CL 109 10/05/2010 1009   CL 109 10/05/2010 1009   CO2 21 10/05/2010 1009   CO2 21 10/05/2010 1009   BUN 12 10/05/2010 1009   BUN 12 10/05/2010 1009   CREATININE 1.06 10/05/2010 1009   CREATININE 1.06 10/05/2010 1009      Component Value Date/Time   CALCIUM 9.2 10/05/2010 1009   CALCIUM 9.2 10/05/2010 1009   ALKPHOS 53 10/05/2010 1009   ALKPHOS 53 10/05/2010 1009   AST 20 10/05/2010 1009   AST 20 10/05/2010 1009   ALT 18 10/05/2010 1009   ALT 18 10/05/2010 1009   BILITOT 0.4 10/05/2010 1009   BILITOT 0.4 10/05/2010 1009       Radiological Studies: CHEST - 2 VIEW  Comparison: 05/22/2011.  Findings: Airspace disease at the right lung base seen on the prior  comparison exam has resolved. Cardiopericardial silhouette appears  within normal limits. Tortuous thoracic aorta. The lungs appear  clear on today's exam. No effusion.  IMPRESSION:  1. No active cardiopulmonary disease.  2. Resolution of right lower lobe opacity.   Impression and Plan:  This is a pleasant 78 year old gentleman with the following issues: 1.  Stage IIB T4 N0 superficial spreading melanoma was on the upper back diagnosed in 2007, status post wide excision with sentinel lymph node sampling without any evidence to suggest recurrent or relapse disease, now five years out.  He has continued to be on active surveillance from my standpoint as well as Dr. Emily Filbert from Dermatology.  He does not have really any evidence to suggest recurrent disease.  The plan at this point, it has been really over five years since his surgery, I will continue to monitor him on an annual basis with liver function test, LDH and chest x-ray.    2.  History of prostate cancer followed by Dr. Aldean Ast.  No evidence of relapse or recurrent disease.    [facility], MD 4/12/201310:50 AM

## 2011-11-15 ENCOUNTER — Other Ambulatory Visit: Payer: Self-pay | Admitting: Dermatology

## 2012-10-14 ENCOUNTER — Other Ambulatory Visit (HOSPITAL_BASED_OUTPATIENT_CLINIC_OR_DEPARTMENT_OTHER): Payer: Medicare Other | Admitting: Lab

## 2012-10-14 ENCOUNTER — Other Ambulatory Visit: Payer: Self-pay | Admitting: Oncology

## 2012-10-14 ENCOUNTER — Telehealth: Payer: Self-pay | Admitting: Oncology

## 2012-10-14 ENCOUNTER — Ambulatory Visit (HOSPITAL_BASED_OUTPATIENT_CLINIC_OR_DEPARTMENT_OTHER): Payer: Medicare Other | Admitting: Oncology

## 2012-10-14 VITALS — BP 122/76 | HR 131 | Temp 97.4°F | Resp 20 | Ht 66.0 in | Wt 184.8 lb

## 2012-10-14 DIAGNOSIS — C439 Malignant melanoma of skin, unspecified: Secondary | ICD-10-CM

## 2012-10-14 DIAGNOSIS — Z8546 Personal history of malignant neoplasm of prostate: Secondary | ICD-10-CM

## 2012-10-14 LAB — CBC WITH DIFFERENTIAL/PLATELET
Eosinophils Absolute: 0.6 10*3/uL — ABNORMAL HIGH (ref 0.0–0.5)
HCT: 39.7 % (ref 38.4–49.9)
LYMPH%: 28.9 % (ref 14.0–49.0)
MCHC: 33.9 g/dL (ref 32.0–36.0)
MCV: 90.4 fL (ref 79.3–98.0)
MONO#: 0.9 10*3/uL (ref 0.1–0.9)
MONO%: 9.4 % (ref 0.0–14.0)
NEUT%: 54.2 % (ref 39.0–75.0)
Platelets: 326 10*3/uL (ref 140–400)
RBC: 4.39 10*6/uL (ref 4.20–5.82)
WBC: 9.6 10*3/uL (ref 4.0–10.3)

## 2012-10-14 LAB — COMPREHENSIVE METABOLIC PANEL (CC13)
Alkaline Phosphatase: 58 U/L (ref 40–150)
CO2: 26 mEq/L (ref 22–29)
Creatinine: 1.2 mg/dL (ref 0.7–1.3)
Glucose: 129 mg/dl — ABNORMAL HIGH (ref 70–99)
Sodium: 138 mEq/L (ref 136–145)
Total Bilirubin: 0.45 mg/dL (ref 0.20–1.20)
Total Protein: 7 g/dL (ref 6.4–8.3)

## 2012-10-14 LAB — LACTATE DEHYDROGENASE (CC13): LDH: 214 U/L (ref 125–245)

## 2012-10-14 NOTE — Telephone Encounter (Signed)
gv and printed appt schedule and avs for pt.... °

## 2012-10-14 NOTE — Progress Notes (Signed)
Hematology and Oncology Follow Up Visit  Jesse Owen [id number] 30-Nov-1929 79 y.o. 10/14/2012 10:06 AM  Jesse Else, MD  Jesse Blackbird, MD  Principle Diagnosis: This is a 79 year old gentleman with the following diagnoses: 1.  Superficial spreading melanoma diagnosed in January 2007.  He has a Breslow's measurement of 4 mm and Clark's level of 4. 2.  History of prostate cancer, status post external beam radiation around year 2000, followed by Dr. Aldean Owen.   Prior Therapy: The patient is status post wide excision for his superficial spreading melanoma.  Again on July 23, 2005, had a wide excision and sentinel lymph node sampling that was negative at that time.  His initial pathological staging was T4a lesion.  Current therapy: Observation and surveillance.  Interim History:  Mr. Jesse Owen presents today for a follow-up visit.  A very pleasant gentleman diagnosed with superficial spreading melanoma in 2007, on his left shoulder.  He is status post wide excision sentinel lymph node sampling, refused adjuvant therapy at that time.  He has been on active surveillance now for over five years.  He has not really had any evidence to suggest recurrent disease.  He has not reported any constitutional symptoms.  He has not reported any major changes in his performance status or activity level.  He has not reported any abdominal discomfort, pain or shortness of breath.  He has not reported any genitourinary complaints.  Again, he has not had any breathing difficulty.  He has not had any fluid retention or fluid overload.  For the most part, activity of daily living remained maintained at this point.  Medications: I have reviewed the patient's current medications. Current outpatient prescriptions:clopidogrel (PLAVIX) 75 MG tablet, Take 75 mg by mouth daily.  , Disp: , Rfl: ;  finasteride (PROSCAR) 5 MG tablet, Take 5 mg by mouth daily.  , Disp: , Rfl: ;  flunisolide (NASAREL) 29 MCG/ACT  (0.025%) nasal spray, Place 2 sprays into the nose as needed. Dose is for each nostril. , Disp: , Rfl: ;  furosemide (LASIX) 40 MG tablet, Take 40 mg by mouth daily., Disp: , Rfl:  loratadine (CLARITIN) 10 MG tablet, Take 10 mg by mouth daily.  , Disp: , Rfl: ;  losartan (COZAAR) 100 MG tablet, Take 100 mg by mouth daily.  , Disp: , Rfl: ;  Multiple Vitamin (MULTIVITAMIN) capsule, Take 1 capsule by mouth daily.  , Disp: , Rfl: ;  simvastatin (ZOCOR) 80 MG tablet, Take 80 mg by mouth at bedtime.  , Disp: , Rfl:   Allergies: No Known Allergies  Past Medical History, Surgical history, Social history, and Family History were reviewed and updated.  Review of Systems: Constitutional:  Negative for fever, chills, night sweats, anorexia, weight loss, pain. Cardiovascular: no chest pain or dyspnea on exertion Respiratory: negative Neurological: no TIA or stroke symptoms Dermatological: negative ENT: negative Skin: Negative. Gastrointestinal: negative Genito-Urinary: negative Hematological and Lymphatic: negative Breast: negative Musculoskeletal: negative Remaining ROS negative. Physical Exam: Blood pressure 122/76, pulse 131, temperature 97.4 F (36.3 C), temperature source Oral, resp. rate 20, height 5\' 6"  (1.676 m), weight 184 lb 12.8 oz (83.825 kg). ECOG: 1 General appearance: alert Head: Normocephalic, without obvious abnormality, atraumatic Neck: no adenopathy, no carotid bruit, no JVD, supple, symmetrical, trachea midline and thyroid not enlarged, symmetric, no tenderness/mass/nodules Lymph nodes: Cervical, supraclavicular, and axillary nodes normal. Heart:regular rate and rhythm, S1, S2 normal, no murmur, click, rub or gallop Lung:chest clear, no wheezing, rales, normal symmetric air entry Abdomin:  soft, non-tender, without masses or organomegaly EXT:no erythema, induration, or nodules Skin: No lesions noted.    Lab Results: Lab Results  Component Value Date   WBC 9.6 10/14/2012    HGB 13.5 10/14/2012   HCT 39.7 10/14/2012   MCV 90.4 10/14/2012   PLT 326 10/14/2012     Chemistry      Component Value Date/Time   NA 140 10/05/2010 1009   K 4.1 10/05/2010 1009   CL 109 10/05/2010 1009   CO2 21 10/05/2010 1009   BUN 12 10/05/2010 1009   CREATININE 1.06 10/05/2010 1009      Component Value Date/Time   CALCIUM 9.2 10/05/2010 1009   ALKPHOS 53 10/05/2010 1009   Owen 20 10/05/2010 1009   ALT 18 10/05/2010 1009   BILITOT 0.4 10/05/2010 1009       Impression and Plan:  This is a pleasant 79 year old gentleman with the following issues: 1.  Stage IIB T4 N0 superficial spreading melanoma was on the upper back diagnosed in 2007, status post wide excision with sentinel lymph node sampling without any evidence to suggest recurrent or relapse disease, now over five years out.  He has continued to be on active surveillance from my standpoint as well as Dr. Emily Owen from Dermatology.  He does not have really any evidence to suggest recurrent disease.  The plan at this point, it has been really over five years since his surgery, I will continue to monitor him on an annual basis with liver function test, LDH and chest x-ray will be done as needed.   2.  History of prostate cancer followed by Urology.  No evidence of relapse or recurrent disease.    [name], MD 4/15/201410:06 AM

## 2012-10-24 ENCOUNTER — Telehealth: Payer: Self-pay | Admitting: Dietician

## 2012-11-21 IMAGING — CR DG CHEST 2V
2 series · 2 of 2 positions shown · non-contrast
Comparison: 05/22/2011.

CLINICAL DATA: Short of breath.  History of melanoma.

CHEST - 2 VIEW

[w chest pa]
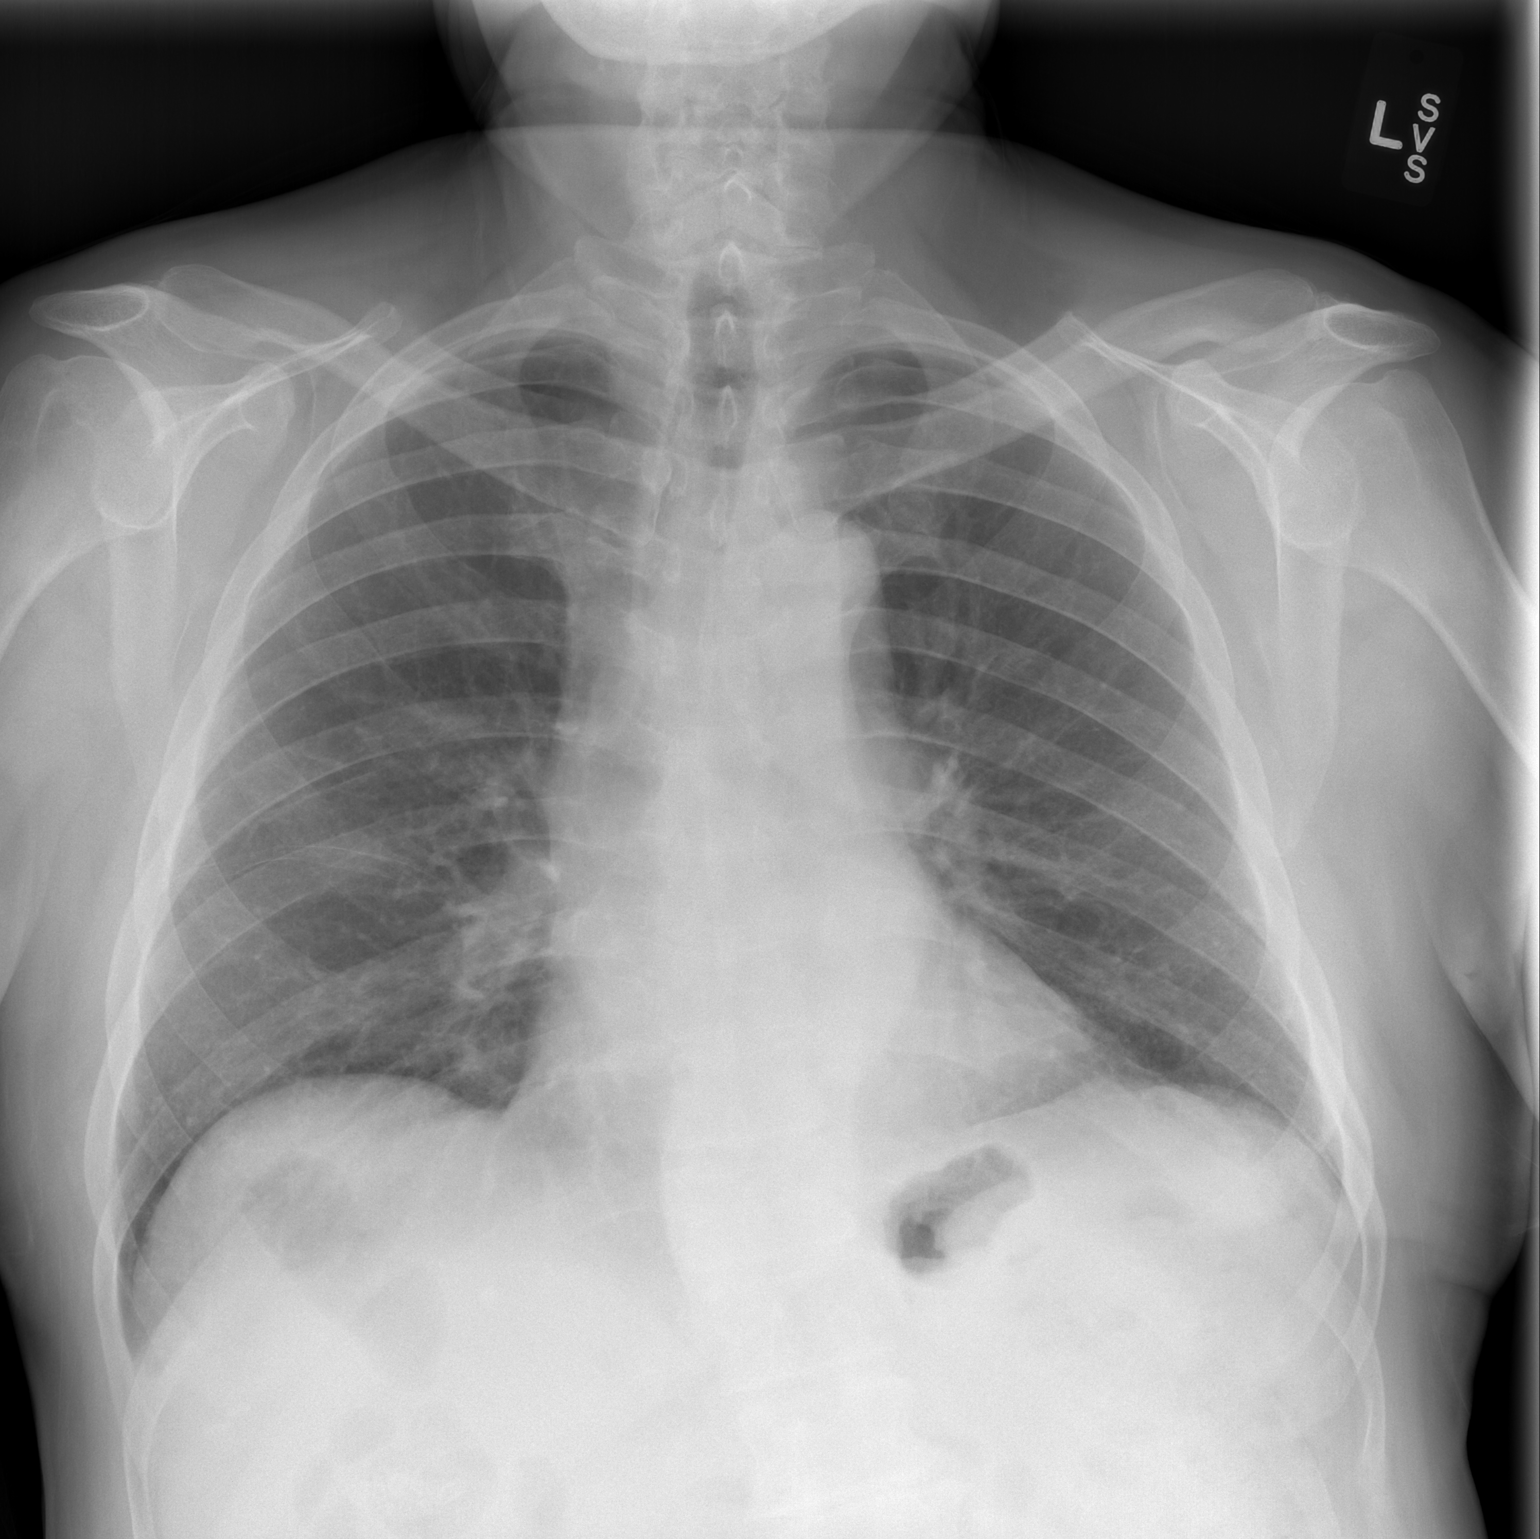

[w chest lat]
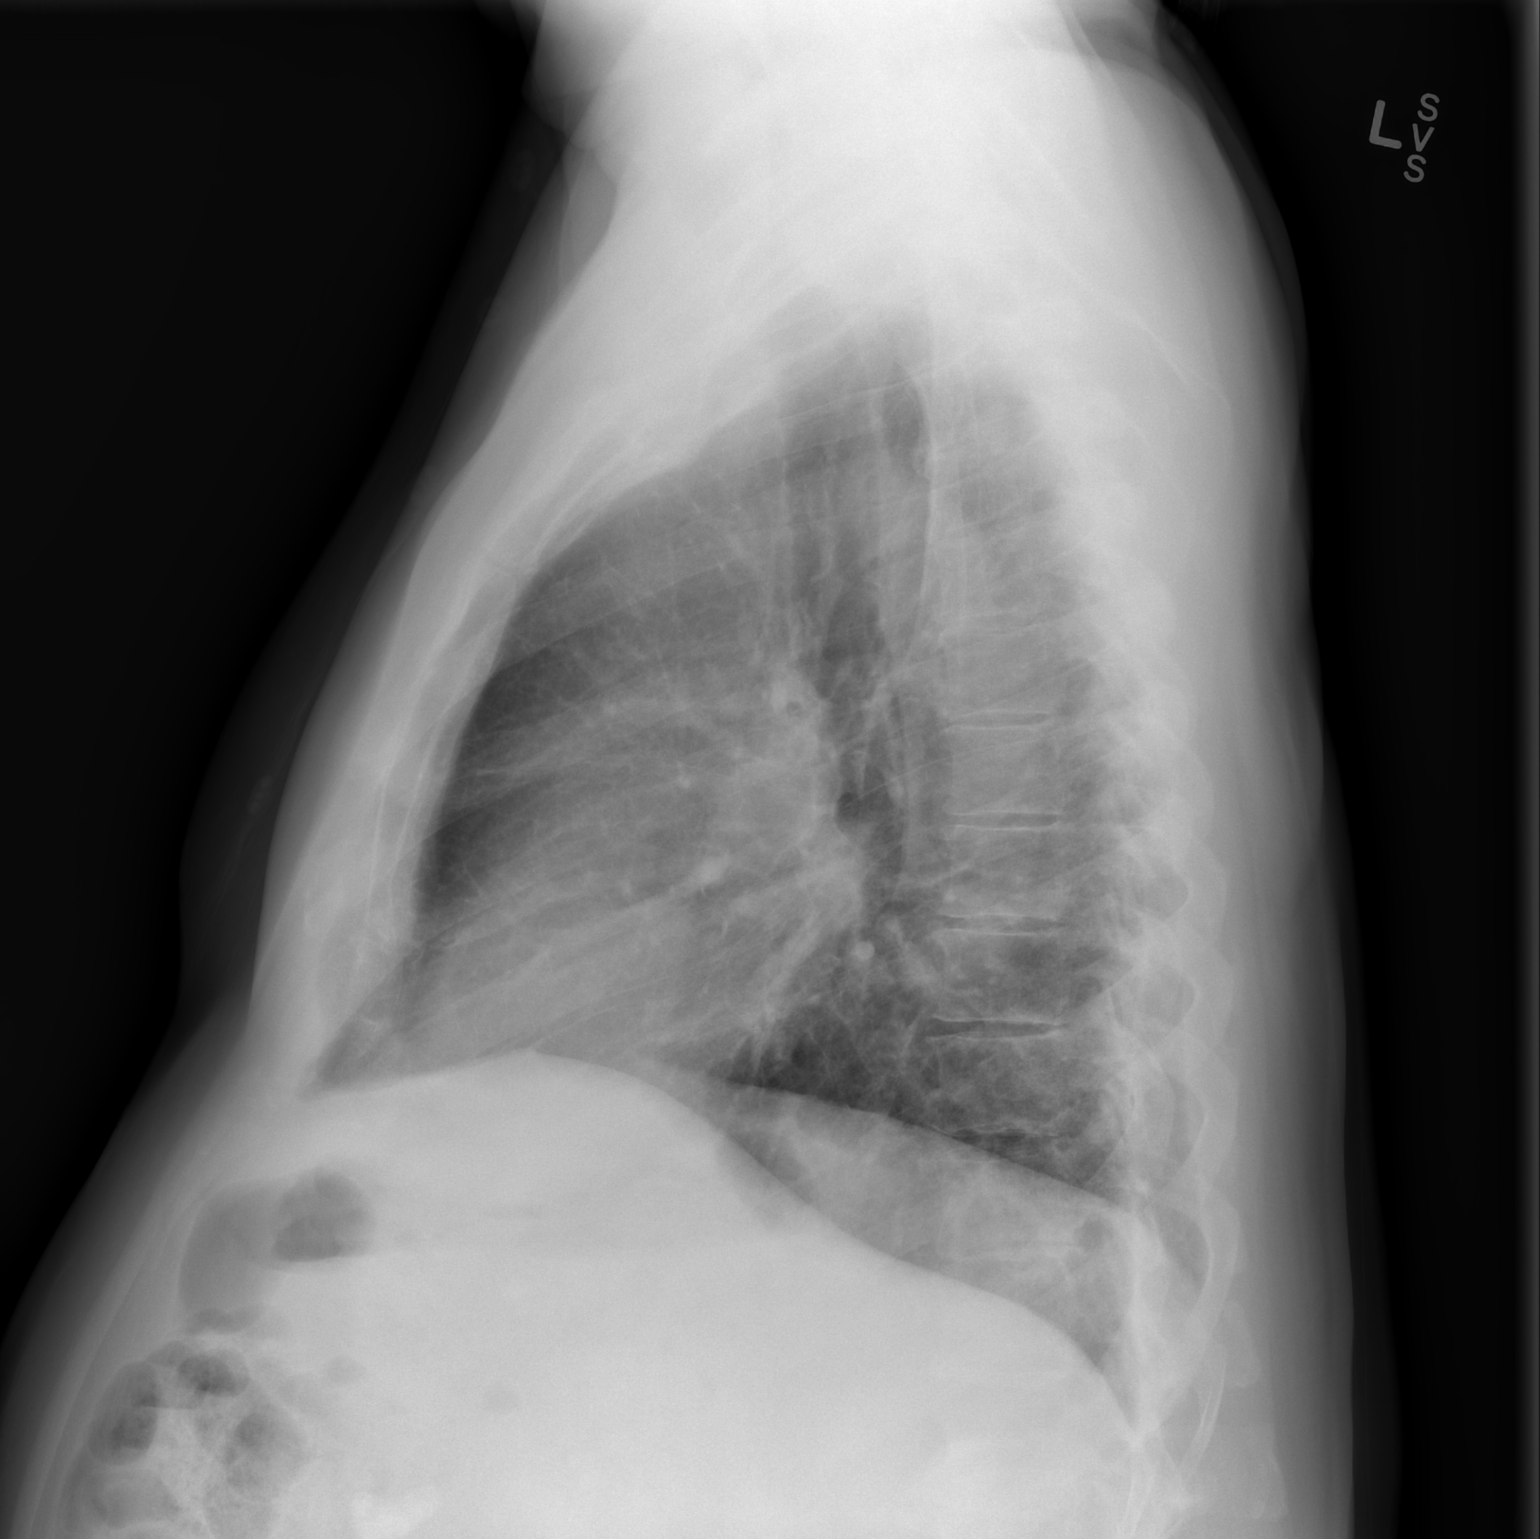

[2 of 2 positions shown; findings below may reference images not displayed]

FINDINGS: Airspace disease at the right lung base seen on the prior
comparison exam has resolved.  Cardiopericardial silhouette appears
within normal limits.  Tortuous thoracic aorta.  The lungs appear
clear on today's exam.  No effusion.
IMPRESSION: 1.  No active cardiopulmonary disease.
2.  Resolution of right lower lobe opacity.

## 2013-04-17 ENCOUNTER — Other Ambulatory Visit: Payer: Self-pay | Admitting: Dermatology

## 2013-07-03 ENCOUNTER — Other Ambulatory Visit: Payer: Self-pay | Admitting: Urology

## 2013-07-09 ENCOUNTER — Encounter (HOSPITAL_COMMUNITY): Payer: Self-pay | Admitting: Pharmacy Technician

## 2013-07-13 ENCOUNTER — Ambulatory Visit (HOSPITAL_COMMUNITY)
Admission: RE | Admit: 2013-07-13 | Discharge: 2013-07-13 | Disposition: A | Discharge status: Home / Self Care | Payer: Medicare Other | Source: Ambulatory Visit | Attending: Urology | Admitting: Urology

## 2013-07-13 ENCOUNTER — Encounter (HOSPITAL_COMMUNITY)
Admission: RE | Admit: 2013-07-13 | Discharge: 2013-07-13 | Disposition: A | Discharge status: Home / Self Care | Payer: Medicare Other | Source: Ambulatory Visit | Attending: Urology | Admitting: Urology

## 2013-07-13 ENCOUNTER — Encounter (HOSPITAL_COMMUNITY): Payer: Self-pay

## 2013-07-13 DIAGNOSIS — R319 Hematuria, unspecified: Secondary | ICD-10-CM | POA: Insufficient documentation

## 2013-07-13 DIAGNOSIS — I771 Stricture of artery: Secondary | ICD-10-CM | POA: Insufficient documentation

## 2013-07-13 DIAGNOSIS — N289 Disorder of kidney and ureter, unspecified: Secondary | ICD-10-CM | POA: Insufficient documentation

## 2013-07-13 DIAGNOSIS — N390 Urinary tract infection, site not specified: Secondary | ICD-10-CM

## 2013-07-13 DIAGNOSIS — Z01818 Encounter for other preprocedural examination: Secondary | ICD-10-CM | POA: Insufficient documentation

## 2013-07-13 DIAGNOSIS — Z85820 Personal history of malignant melanoma of skin: Secondary | ICD-10-CM | POA: Insufficient documentation

## 2013-07-13 DIAGNOSIS — Z01810 Encounter for preprocedural cardiovascular examination: Secondary | ICD-10-CM | POA: Insufficient documentation

## 2013-07-13 HISTORY — DX: Allergy status to unspecified drugs, medicaments and biological substances: Z88.9

## 2013-07-13 HISTORY — DX: Unspecified cataract: H26.9

## 2013-07-13 HISTORY — DX: Urinary tract infection, site not specified: N39.0

## 2013-07-13 HISTORY — DX: Unspecified hearing loss, unspecified ear: H91.90

## 2013-07-13 LAB — BASIC METABOLIC PANEL
BUN: 22 mg/dL (ref 6–23)
CALCIUM: 10 mg/dL (ref 8.4–10.5)
CO2: 26 mEq/L (ref 19–32)
CREATININE: 1.23 mg/dL (ref 0.50–1.35)
Chloride: 101 mEq/L (ref 96–112)
GFR, EST AFRICAN AMERICAN: 62 mL/min — AB (ref >90)
GFR, EST NON AFRICAN AMERICAN: 53 mL/min — AB (ref >90)
Glucose, Bld: 117 mg/dL — ABNORMAL HIGH (ref 70–99)
Potassium: 4.2 mEq/L (ref 3.7–5.3)
Sodium: 138 mEq/L (ref 137–147)

## 2013-07-13 LAB — CBC
HCT: 37.6 % — ABNORMAL LOW (ref 39.0–52.0)
Hemoglobin: 13.1 g/dL (ref 13.0–17.0)
MCH: 31.8 pg (ref 26.0–34.0)
MCHC: 34.8 g/dL (ref 30.0–36.0)
MCV: 91.3 fL (ref 78.0–100.0)
Platelets: 375 10*3/uL (ref 150–400)
RBC: 4.12 MIL/uL — ABNORMAL LOW (ref 4.22–5.81)
RDW: 13.2 % (ref 11.5–15.5)
WBC: 9.9 10*3/uL (ref 4.0–10.5)

## 2013-07-13 NOTE — Patient Instructions (Addendum)
Jesse Owen  07/13/2013   Your procedure is scheduled on:   07-15-2013  Report to [employer] at     52   AM .  Call this number if you have problems the morning of surgery: [phone]  Or Presurgical Testing [phone]([facility]) For Living Will and/or Health Care Power Attorney Forms: please provide copy for your medical record,may bring AM of surgery(Forms should be already notarized -we do not provide this service). Thanks for providing copy today.  .    Do not eat food:After Midnight.  May have clear liquids:up to 6 Hours before arrival. Nothing after : 0700 AM  Clear liquids include soda, tea, black coffee, apple or grape juice, broth.  Take these medicines the morning of surgery with A SIP OF WATER: Proscar.   Do not wear jewelry, make-up or nail polish.  Do not wear lotions, powders, or perfumes. You may wear deodorant.  Do not shave 12 hours prior to first CHG shower(legs and under arms).(face and neck okay.)  Do not bring valuables to the hospital.(Hospital is not responsible for lost valuables).  Contacts, dentures or removable bridgework, body piercing, hair pins may not be worn into surgery.  Leave suitcase in the car. After surgery it may be brought to your room.  For patients admitted to the hospital, checkout time is 11:00 AM the day of discharge.(Restricted visitors-Persons, age 49 or younger - may not visit at this time.)    Patients discharged the day of surgery will not be allowed to drive home. Must have responsible person with you x 24 hours once discharged.  Name and phone number of your driver: [name] [phone] cell  Special Instructions: CHG(Chlorhedine 4%-"Hibiclens","Betasept","Aplicare") Shower Use Special Wash: see special instructions.(avoid face and genitals)     Failure to follow these instructions may result in Cancellation of your surgery.   Patient  signature_______________________________________________________

## 2013-07-13 NOTE — Pre-Procedure Instructions (Signed)
07-13-13 EKG/ CXR done today

## 2013-07-15 ENCOUNTER — Encounter (HOSPITAL_COMMUNITY): Payer: Self-pay | Admitting: *Deleted

## 2013-07-15 ENCOUNTER — Encounter (HOSPITAL_COMMUNITY): Payer: Medicare Other | Admitting: Anesthesiology

## 2013-07-15 ENCOUNTER — Ambulatory Visit (HOSPITAL_COMMUNITY)
Admission: RE | Admit: 2013-07-15 | Discharge: 2013-07-15 | Disposition: A | Discharge status: Home / Self Care | Payer: Medicare Other | Source: Ambulatory Visit | Attending: Urology | Admitting: Urology

## 2013-07-15 ENCOUNTER — Ambulatory Visit (HOSPITAL_COMMUNITY): Payer: Medicare Other | Admitting: Anesthesiology

## 2013-07-15 ENCOUNTER — Encounter (HOSPITAL_COMMUNITY)
Admission: RE | Disposition: A | Discharge status: Home / Self Care | Payer: Self-pay | Source: Ambulatory Visit | Attending: Urology

## 2013-07-15 DIAGNOSIS — N390 Urinary tract infection, site not specified: Secondary | ICD-10-CM | POA: Insufficient documentation

## 2013-07-15 DIAGNOSIS — Z7902 Long term (current) use of antithrombotics/antiplatelets: Secondary | ICD-10-CM | POA: Insufficient documentation

## 2013-07-15 DIAGNOSIS — C659 Malignant neoplasm of unspecified renal pelvis: Secondary | ICD-10-CM | POA: Insufficient documentation

## 2013-07-15 DIAGNOSIS — N135 Crossing vessel and stricture of ureter without hydronephrosis: Secondary | ICD-10-CM | POA: Insufficient documentation

## 2013-07-15 DIAGNOSIS — E785 Hyperlipidemia, unspecified: Secondary | ICD-10-CM | POA: Insufficient documentation

## 2013-07-15 DIAGNOSIS — Z85820 Personal history of malignant melanoma of skin: Secondary | ICD-10-CM | POA: Insufficient documentation

## 2013-07-15 DIAGNOSIS — Z8673 Personal history of transient ischemic attack (TIA), and cerebral infarction without residual deficits: Secondary | ICD-10-CM | POA: Insufficient documentation

## 2013-07-15 DIAGNOSIS — R3129 Other microscopic hematuria: Secondary | ICD-10-CM | POA: Insufficient documentation

## 2013-07-15 DIAGNOSIS — Z8546 Personal history of malignant neoplasm of prostate: Secondary | ICD-10-CM | POA: Insufficient documentation

## 2013-07-15 DIAGNOSIS — R319 Hematuria, unspecified: Secondary | ICD-10-CM

## 2013-07-15 DIAGNOSIS — N2889 Other specified disorders of kidney and ureter: Secondary | ICD-10-CM

## 2013-07-15 DIAGNOSIS — I10 Essential (primary) hypertension: Secondary | ICD-10-CM | POA: Insufficient documentation

## 2013-07-15 HISTORY — PX: CYSTOSCOPY WITH RETROGRADE PYELOGRAM, URETEROSCOPY AND STENT PLACEMENT: SHX5789

## 2013-07-15 HISTORY — DX: Hematuria, unspecified: R31.9

## 2013-07-15 SURGERY — CYSTOURETEROSCOPY, WITH RETROGRADE PYELOGRAM AND STENT INSERTION
Anesthesia: General | Laterality: Left

## 2013-07-15 MED ORDER — OXYBUTYNIN CHLORIDE 5 MG PO TABS: 5.0000 mg | ORAL_TABLET | Freq: Four times a day (QID) | ORAL | Status: DC | PRN

## 2013-07-15 MED ORDER — FENTANYL CITRATE 0.05 MG/ML IJ SOLN: 25.0000 ug | INTRAMUSCULAR | Status: DC | PRN

## 2013-07-15 MED ORDER — ONDANSETRON HCL 4 MG/2ML IJ SOLN
INTRAMUSCULAR | Status: DC | PRN
  Administered 2013-07-15: 4 mg via INTRAVENOUS

## 2013-07-15 MED ORDER — BELLADONNA ALKALOIDS-OPIUM 16.2-60 MG RE SUPP
RECTAL | Status: AC
  Filled 2013-07-15: qty 1

## 2013-07-15 MED ORDER — CEFAZOLIN SODIUM-DEXTROSE 2-3 GM-% IV SOLR
INTRAVENOUS | Status: AC
  Filled 2013-07-15: qty 50

## 2013-07-15 MED ORDER — BELLADONNA ALKALOIDS-OPIUM 16.2-60 MG RE SUPP
RECTAL | Status: DC | PRN
  Administered 2013-07-15: 1 via RECTAL

## 2013-07-15 MED ORDER — IOHEXOL 300 MG/ML  SOLN
INTRAMUSCULAR | Status: DC | PRN
  Administered 2013-07-15: 20 mL via URETHRAL

## 2013-07-15 MED ORDER — HYOSCYAMINE SULFATE 0.125 MG PO TABS: 0.1250 mg | ORAL_TABLET | ORAL | Status: DC | PRN

## 2013-07-15 MED ORDER — CEFAZOLIN SODIUM-DEXTROSE 2-3 GM-% IV SOLR
2.0000 g | INTRAVENOUS | Status: AC
  Administered 2013-07-15: 2 g via INTRAVENOUS

## 2013-07-15 MED ORDER — OXYCODONE-ACETAMINOPHEN 5-325 MG PO TABS: 1.0000 | ORAL_TABLET | ORAL | Status: DC | PRN

## 2013-07-15 MED ORDER — LACTATED RINGERS IV SOLN
INTRAVENOUS | Status: DC | PRN
  Administered 2013-07-15: 13:00:00 via INTRAVENOUS

## 2013-07-15 MED ORDER — LIDOCAINE HCL (CARDIAC) 20 MG/ML IV SOLN
INTRAVENOUS | Status: AC
  Filled 2013-07-15: qty 5

## 2013-07-15 MED ORDER — FENTANYL CITRATE 0.05 MG/ML IJ SOLN
INTRAMUSCULAR | Status: DC | PRN
  Administered 2013-07-15 (×3): 25 ug via INTRAVENOUS

## 2013-07-15 MED ORDER — TAMSULOSIN HCL 0.4 MG PO CAPS: 0.4000 mg | ORAL_CAPSULE | Freq: Every day | ORAL | Status: DC

## 2013-07-15 MED ORDER — LIDOCAINE HCL (CARDIAC) 20 MG/ML IV SOLN
INTRAVENOUS | Status: DC | PRN
  Administered 2013-07-15: 50 mg via INTRAVENOUS

## 2013-07-15 MED ORDER — PHENAZOPYRIDINE HCL 200 MG PO TABS: 200.0000 mg | ORAL_TABLET | Freq: Three times a day (TID) | ORAL | Status: DC | PRN

## 2013-07-15 MED ORDER — PROPOFOL 10 MG/ML IV BOLUS
INTRAVENOUS | Status: AC
  Filled 2013-07-15: qty 20

## 2013-07-15 MED ORDER — SENNOSIDES-DOCUSATE SODIUM 8.6-50 MG PO TABS: 1.0000 | ORAL_TABLET | Freq: Two times a day (BID) | ORAL | Status: DC

## 2013-07-15 MED ORDER — FENTANYL CITRATE 0.05 MG/ML IJ SOLN
INTRAMUSCULAR | Status: AC
  Filled 2013-07-15: qty 2

## 2013-07-15 MED ORDER — LIDOCAINE HCL 2 % EX GEL
CUTANEOUS | Status: AC
  Filled 2013-07-15: qty 10

## 2013-07-15 MED ORDER — PROPOFOL 10 MG/ML IV BOLUS
INTRAVENOUS | Status: DC | PRN
  Administered 2013-07-15: 50 mg via INTRAVENOUS
  Administered 2013-07-15: 150 mg via INTRAVENOUS

## 2013-07-15 MED ORDER — LACTATED RINGERS IV SOLN
INTRAVENOUS | Status: DC
  Administered 2013-07-15: 15:00:00 via INTRAVENOUS

## 2013-07-15 MED ORDER — LIDOCAINE HCL 2 % EX GEL
CUTANEOUS | Status: DC | PRN
Start: 2013-07-15 — End: 2013-07-15
  Administered 2013-07-15: 1 via URETHRAL

## 2013-07-15 MED ORDER — ONDANSETRON HCL 4 MG/2ML IJ SOLN
INTRAMUSCULAR | Status: AC
Start: 2013-07-15 — End: 2013-07-15
  Filled 2013-07-15: qty 2

## 2013-07-15 SURGICAL SUPPLY — 36 items
BAG URO CATCHER STRL LF (DRAPE) ×2 IMPLANT
BASKET LASER NITINOL 1.9FR (BASKET) IMPLANT
BASKET STNLS GEMINI 4WIRE 3FR (BASKET) IMPLANT
BASKET ZERO TIP NITINOL 2.4FR (BASKET) IMPLANT
BSKT STON RTRVL 120 1.9FR (BASKET)
BSKT STON RTRVL GEM 120X11 3FR (BASKET)
BSKT STON RTRVL ZERO TP 2.4FR (BASKET)
CATH CLEAR GEL 3F BACKSTOP (CATHETERS) IMPLANT
CATH FOLEY 2WAY SLVR  5CC 18FR (CATHETERS) ×2
CATH FOLEY 2WAY SLVR 5CC 18FR (CATHETERS) IMPLANT
CATH URET 5FR 28IN CONE TIP (BALLOONS)
CATH URET 5FR 28IN OPEN ENDED (CATHETERS) ×2 IMPLANT
CATH URET 5FR 70CM CONE TIP (BALLOONS) IMPLANT
CATH URET DUAL LUMEN 6-10FR 50 (CATHETERS) ×2 IMPLANT
CLOTH BEACON ORANGE TIMEOUT ST (SAFETY) ×3 IMPLANT
DRAPE CAMERA CLOSED 9X96 (DRAPES) ×3 IMPLANT
FIBER LASER FLEXIVA 200 (UROLOGICAL SUPPLIES) IMPLANT
FIBER LASER FLEXIVA 365 (UROLOGICAL SUPPLIES) IMPLANT
FORCEPS BIOP 2.4F 115CM BACKLD (INSTRUMENTS) ×2 IMPLANT
GLOVE BIOGEL M 7.0 STRL (GLOVE) ×3 IMPLANT
GOWN STRL REUS W/TWL LRG LVL3 (GOWN DISPOSABLE) ×3 IMPLANT
GUIDEWIRE ANG ZIPWIRE 038X150 (WIRE) IMPLANT
GUIDEWIRE STR DUAL SENSOR (WIRE) ×5 IMPLANT
IV NS IRRIG 3000ML ARTHROMATIC (IV SOLUTION) ×3 IMPLANT
KIT BALLN UROMAX 15FX4 (MISCELLANEOUS) IMPLANT
KIT BALLN UROMAX 26 75X4 (MISCELLANEOUS) ×2
PACK CYSTO (CUSTOM PROCEDURE TRAY) ×3 IMPLANT
SCRUB PCMX 4 OZ (MISCELLANEOUS) IMPLANT
SHEATH ACCESS URETERAL 38CM (SHEATH) ×2 IMPLANT
SHEATH ACCESS URETERAL 54CM (SHEATH) ×2 IMPLANT
SHEATH URET ACCESS 12FR/35CM (UROLOGICAL SUPPLIES) IMPLANT
SHEATH URET ACCESS 12FR/55CM (UROLOGICAL SUPPLIES) IMPLANT
STENT CONTOUR 6FRX26X.038 (STENTS) ×2 IMPLANT
SYRINGE IRR TOOMEY STRL 70CC (SYRINGE) IMPLANT
TUBING CONNECTING 10 (TUBING) ×2 IMPLANT
TUBING CONNECTING 10' (TUBING) ×1

## 2013-07-15 NOTE — Anesthesia Postprocedure Evaluation (Signed)
  Anesthesia Post-op Note  Patient: Jesse Owen  Procedure(s) Performed: Procedure(s) (LRB): CYSTOSCOPY WITH BILATERAL RETROGRADE PYELOGRAM, URETEROSCOPY AND LEFT URETERA LDILATION STENT PLACEMENT AND  RENAL MASS BIOPSY (Left)  Patient Location: PACU  Anesthesia Type: General  Level of Consciousness: awake and alert   Airway and Oxygen Therapy: Patient Spontanous Breathing  Post-op Pain: mild  Post-op Assessment: Post-op Vital signs reviewed, Patient's Cardiovascular Status Stable, Respiratory Function Stable, Patent Airway and No signs of Nausea or vomiting  Last Vitals:  Filed Vitals:   07/15/13 1546  BP: 143/75  Pulse: 95  Temp: 36.5 C  Resp: 18    Post-op Vital Signs: stable   Complications: No apparent anesthesia complications

## 2013-07-15 NOTE — Discharge Instructions (Signed)
DISCHARGE INSTRUCTIONS FOR KIDNEY STONES OR URETERAL STENT  MEDICATIONS:   1. RESUME YOUR PLAVIX THIS Saturday.  2. Resume all your other meds from home - except do not take any other pain meds that you may have at home.  ACTIVITY 1. No strenuous activity x 1week 2. No driving while on narcotic pain medications 3. Drink plenty of water 4. Continue to walk at home - you can still get blood clots when you are at home, so keep active, but don't over do it. 5. May return to work in 3 days.  BATHING 1. You can shower.    SIGNS/SYMPTOMS TO CALL: 1. Please call us if you have a fever greater than 101.5, uncontrolled  nausea/vomiting, uncontrolled pain, dizziness, unable to urinate, chest pain, shortness of breath, leg swelling, leg pain, redness around wound, drainage from wound, or any other concerns or questions.  You can reach [place] at [phone].

## 2013-07-15 NOTE — Transfer of Care (Signed)
Immediate Anesthesia Transfer of Care Note  Patient: Jesse Owen  Procedure(s) Performed: Procedure(s): CYSTOSCOPY WITH BILATERAL RETROGRADE PYELOGRAM, URETEROSCOPY AND LEFT URETERA LDILATION STENT PLACEMENT AND  RENAL MASS BIOPSY (Left)  Patient Location: PACU  Anesthesia Type:General  Level of Consciousness: awake and alert   Airway & Oxygen Therapy: Patient Spontanous Breathing and Patient connected to face mask oxygen  Post-op Assessment: Report given to PACU RN and Post -op Vital signs reviewed and stable  Post vital signs: Reviewed and stable  Complications: No apparent anesthesia complications

## 2013-07-15 NOTE — H&P (Signed)
Urology History and Physical Exam  CC: Microscopic hematuria. Left renal mass.  HPI:  80 year old male presents today for microscopic hematuria and a left renal mass.  The microscopic hematuria is a chronic ongoing problem.  He had a cystoscopy in December 2014 which was negative for tumors.  A CT hematuria protocol which revealed a left renal mass.  This is located in the upper pole of the left kidney.  It is in the collecting system.  It is approximately 1.9 cm in size.  It is enhancing.  It is causing a filling defect.  It appears to be urothelial in origin.  He presents today for cystoscopy, bilateral retrograde pyelograms, left ureteroscopy, biopsy of the mass, and possible left ureter stent placement.  We discussed the risks, benefits, side effects, and likelihood of achieving goals.  He has been cleared by his primary physician, Dr. Alyson Ingles, for surgery and hold his Plavix 5 days prior to surgery; he is to restart Plavix as soon as possible.  He returned to clinic 07/09/13 with complaints of dysuria.  Urine culture at that time revealed Staphylococcus coagulase-negative resistant only to penicillin.  He was started on ciprofloxacin.  It is also sensitive to Ancef which he will receive IV prior to this procedure today.  PMH: Past Medical History  Diagnosis Date  . Hypertension   . Hyperlipemia   . TIA (transient ischemic attack)   . Hearing loss     left ear   . Prostate cancer     tx. radiation- greater than 10 yrs  . H/O seasonal allergies     mild hayfever  . Cataracts, bilateral   . UTI (urinary tract infection) 07-13-13    at present to start antibiotic,not yet started or picked up from pharmacy    [place]: Past Surgical History  Procedure Laterality Date  . Melanoma excision      Allergies: No Known Allergies  Medications: No prescriptions prior to admission     Social History: History   Social History  . Marital Status: Married    Spouse Name: N/A    Number of  Children: N/A  . Years of Education: N/A   Occupational History  . Not on file.   Social History Main Topics  . Smoking status: Never Smoker   . Smokeless tobacco: Not on file  . Alcohol Use: No  . Drug Use: No  . Sexual Activity: Not Currently   Other Topics Concern  . Not on file   Social History Narrative  . No narrative on file    Family History: No family history on file.  Review of Systems: Positive: Gross hematuria. Negative: Fever, SOB, or chest pain.  A further 10 point review of systems was negative except what is listed in the HPI.  Physical Exam: Filed Vitals:   07/15/13 1011  BP: 141/87  Pulse: 111  Temp: 98.1 F (36.7 C)  Resp: 16    General: No acute distress.  Awake. Head:  Normocephalic.  Atraumatic. ENT:  EOMI.  Mucous membranes moist Neck:  Supple.  No lymphadenopathy. CV:  S1 present. S2 present. Regular rate. Pulmonary: Equal effort bilaterally.  Clear to auscultation bilaterally. Abdomen: Soft.  Non- tender to palpation. Skin:  Normal turgor.  No visible rash. Extremity: No gross deformity of bilateral upper extremities.  No gross deformity of    bilateral lower extremities. Neurologic: Alert. Appropriate mood.   Studies:  Recent Labs     07/13/13  1015  HGB  13.1  WBC  9.9  PLT  375    Recent Labs     07/13/13  1015  NA  138  K  4.2  CL  101  CO2  26  BUN  22  CREATININE  1.23  CALCIUM  10.0  GFRNONAA  53*  GFRAA  62*     No results found for this basename: PT, INR, APTT,  in the last 72 hours   No components found with this basename: ABG,     Assessment:  Microscopic hematuria. Left renal mass.  Plan: To OR  for cystoscopy, bilateral retrograde pyelograms, left ureteroscopy, biopsy of the mass, and possible left ureter stent placement.

## 2013-07-15 NOTE — Progress Notes (Signed)
Patient states he started passing small blood clots in his urine 07/14/2013

## 2013-07-15 NOTE — Op Note (Signed)
Urology Operative Report  Date of Procedure: 07/15/13  Surgeon: Rolan Bucco, MD Assistant:  None  Preoperative Diagnosis: Left renal pelvic tumor. 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  Procedure(s): Left ureteroscopy with biopsy of left renal pelvis mass. Left ureter dilation (2 different areas; one in the left distal ureter and 1 in the mid ureter.) Bilateral retrograde pyelograms with interpretation. Left ureter stent placement.  Estimated blood loss: Minimal  Specimen: Left renal pelvis biopsy (x2)  Drains: 28BT foley  Complications: None  Findings: Large left renal pelvis mass. 2 areas of stenosis in the left ureter which required dilation involving the distal and mid ureter; one area in the proximal left ureter which did not require dilation. Negative filling defects in the right ureter or collecting system and no hydronephrosis. A large filling defect in the left collecting system. Negative bladder tumors. Negative left ureter tumors.  History of present illness: 80 year old male with ongoing microscopic hematuria was found to have a filling defect with an enhancing left renal pelvic mass. He presents today for biopsy of this mass.   Procedure in detail: After informed consent was obtained, the patient was taken to the operating room. They were placed in the supine position. SCDs were turned on and in place. IV antibiotics were infused, and general anesthesia was induced. A timeout was performed in which the correct patient, surgical site, and procedure were identified and agreed upon by the team.  The patient was placed in a dorsolithotomy position, making sure to pad all pertinent neurovascular pressure points. The genitals were prepped and draped in the usual sterile fashion.  A rigid cystoscope was advanced through the urethra and into the bladder. Bladder was drained and then evaluated in a systematic fashion after was fully  distended with a 30 and 70 lens to visualize the entire surface of the bladder. This was negative for tumors.  I obtained bilateral retrograde pyelograms. I cannulated the right ureter orifice with a 5 French ureter catheter and injected 10 cc of Omnipaque. This was negative for filling defects or hydronephrosis. It emptied promptly. Attention was turned to the left ureter orifice. It was cannulated with a 5 [place] ureter catheter and I injected 10 cc of Omnipaque. This was negative for hydroureter, but there were several areas of narrowing through the ureter including the distal ureter, mid ureter, and proximal ureter. There is also a large filling defect in the left renal pelvis.  I then proceeded with left ureteroscopy. I placed 2 sensor wires up the left ureter into the left renal pelvis on fluoroscopy. I attempted to place the obturator to a 12-14 ureter access sheath up the left ureter, but this was not successful. I used 1 wires a security wire and this was clamped to the drape. I then used a balloon ureter dilator was 4 cm in length and 15 French in diameter. This was placed under fluoroscopy over the working wire in the distal area over the area of stenosis. Omnipaque was placed into the balloon and this was dilated to 14 atmospheres. The balloon was deflated and removed. The obturator to the ureter access sheath was again attempted to be placed, but he could not be placed beyond the mid ureter. This area was also dilated in the same fashion up to 14 atmospheres and then deflated. The ureter access sheath was then placed into the left proximal ureter and the operator and wire were removed. I was able to navigate the ureteroscope into the  renal pelvis but noted there was stenosis of the left proximal ureter. In the renal pelvis there was noted to be a large amount of tumor consistent with urothelial carcinoma. I then removed the ureter access sheath and ureter scope. I then used the safety wire to  place a dual-lumen ureter access sheath over the safety wire and into the left renal pelvis to dilate the left proximal ureter. I then placed a second sensor wire into the left renal pelvis. The dual-lumen access sheath was removed and one wire was secured as a safety wire. I was then able to place a long 12/14 ureter access sheath over the working wire into the left renal pelvis, and the obturator and wire were removed. I was then able to place the ureter scope into the left renal pelvis after I had loaded the Bigopsy biopsy forceps. Several areas of the tissue were removed and sent in one specimen, and then a large piece of tissue was removed and sent as a second specimen. There did not appear to be much bleeding. I withdrew the ureter scope and the access sheath and noted disruption of the mucosa in the proximal and mid ureter.  I elected to leave a left ureter stent and placed a 6 x 26 double-J stent over the working wire through the cystoscope. Tethering string was removed. There was a good curl in the left renal pelvis and a curl in the bladder. I placed 10 cc of lidocaine jelly into the urethra and then an 18 French Foley catheter with 10 cc of water in the balloon. I then placed a belladonna and opium suppository to the rectum.  Patient was placed back in supine position, anesthesia was reversed, and he was taken to the [facility] in stable condition.  All counts were correct at the end of the case.  He will resume his ciprofloxacin and followup with me in clinic for pathology results and catheter removal.

## 2013-07-15 NOTE — Anesthesia Preprocedure Evaluation (Addendum)
Anesthesia Evaluation  Patient identified by MRN, date of birth, ID band Patient awake  General Assessment Comment: Hypertension     .  Hyperlipemia     .  TIA (transient ischemic attack)     .  Hearing loss         left ear    .  Prostate cancer         tx. radiation- greater than 10 yrs   .  H/O seasonal allergies         mild hayfever   .  Cataracts, bilateral     .  UTI (urinary tract infection)  07-13-13       at present to start antibiotic,not yet started or picked up from pharmacy        Reviewed: Allergy & Precautions, H&P , NPO status , Patient's Chart, lab work & pertinent test results  Airway Mallampati: II TM Distance: >3 FB Neck ROM: Full    Dental  (+) Edentulous Upper, Poor Dentition and Dental Advisory Given   Pulmonary neg pulmonary ROS,  breath sounds clear to auscultation  Pulmonary exam normal       Cardiovascular Exercise Tolerance: Good hypertension, Pt. on medications Rhythm:Regular Rate:Normal     Neuro/Psych TIAnegative psych ROS   GI/Hepatic negative GI ROS, Neg liver ROS,   Endo/Other  negative endocrine ROS  Renal/GU negative Renal ROS  negative genitourinary   Musculoskeletal negative musculoskeletal ROS (+)   Abdominal   Peds negative pediatric ROS (+)  Hematology negative hematology ROS (+)   Anesthesia Other Findings   Reproductive/Obstetrics negative OB ROS                         Anesthesia Physical Anesthesia Plan  ASA: III  Anesthesia Plan: General   Post-op Pain Management:    Induction: Intravenous  Airway Management Planned: LMA  Additional Equipment:   Intra-op Plan:   Post-operative Plan: Extubation in OR  Informed Consent: I have reviewed the patients History and Physical, chart, labs and discussed the procedure including the risks, benefits and alternatives for the proposed anesthesia with the patient or authorized  representative who has indicated his/her understanding and acceptance.   Dental advisory given  Plan Discussed with: CRNA  Anesthesia Plan Comments:         Anesthesia Quick Evaluation

## 2013-07-16 ENCOUNTER — Encounter (HOSPITAL_COMMUNITY): Payer: Self-pay | Admitting: Urology

## 2013-07-31 ENCOUNTER — Other Ambulatory Visit: Payer: Self-pay | Admitting: Urology

## 2013-08-05 ENCOUNTER — Encounter (HOSPITAL_COMMUNITY): Payer: Self-pay | Admitting: Emergency Medicine

## 2013-08-05 ENCOUNTER — Emergency Department (HOSPITAL_COMMUNITY): Payer: Medicare Other

## 2013-08-05 ENCOUNTER — Inpatient Hospital Stay (HOSPITAL_COMMUNITY)
Admission: EM | Admit: 2013-08-05 | Discharge: 2013-08-11 | DRG: 196 | Disposition: A | Discharge status: Home / Self Care | Payer: Medicare Other | Attending: Cardiovascular Disease | Admitting: Cardiovascular Disease

## 2013-08-05 DIAGNOSIS — N179 Acute kidney failure, unspecified: Secondary | ICD-10-CM | POA: Diagnosis present

## 2013-08-05 DIAGNOSIS — J9600 Acute respiratory failure, unspecified whether with hypoxia or hypercapnia: Secondary | ICD-10-CM | POA: Diagnosis present

## 2013-08-05 DIAGNOSIS — T502X5A Adverse effect of carbonic-anhydrase inhibitors, benzothiadiazides and other diuretics, initial encounter: Secondary | ICD-10-CM | POA: Diagnosis present

## 2013-08-05 DIAGNOSIS — J9601 Acute respiratory failure with hypoxia: Secondary | ICD-10-CM | POA: Diagnosis present

## 2013-08-05 DIAGNOSIS — J84112 Idiopathic pulmonary fibrosis: Principal | ICD-10-CM | POA: Diagnosis present

## 2013-08-05 DIAGNOSIS — M329 Systemic lupus erythematosus, unspecified: Secondary | ICD-10-CM | POA: Diagnosis present

## 2013-08-05 DIAGNOSIS — N390 Urinary tract infection, site not specified: Secondary | ICD-10-CM | POA: Diagnosis present

## 2013-08-05 DIAGNOSIS — N189 Chronic kidney disease, unspecified: Secondary | ICD-10-CM | POA: Diagnosis present

## 2013-08-05 DIAGNOSIS — I951 Orthostatic hypotension: Secondary | ICD-10-CM | POA: Diagnosis present

## 2013-08-05 DIAGNOSIS — J439 Emphysema, unspecified: Secondary | ICD-10-CM | POA: Diagnosis present

## 2013-08-05 DIAGNOSIS — J8410 Pulmonary fibrosis, unspecified: Secondary | ICD-10-CM | POA: Diagnosis present

## 2013-08-05 DIAGNOSIS — E785 Hyperlipidemia, unspecified: Secondary | ICD-10-CM | POA: Diagnosis present

## 2013-08-05 DIAGNOSIS — I129 Hypertensive chronic kidney disease with stage 1 through stage 4 chronic kidney disease, or unspecified chronic kidney disease: Secondary | ICD-10-CM | POA: Diagnosis present

## 2013-08-05 DIAGNOSIS — I2510 Atherosclerotic heart disease of native coronary artery without angina pectoris: Secondary | ICD-10-CM | POA: Diagnosis present

## 2013-08-05 DIAGNOSIS — Z79899 Other long term (current) drug therapy: Secondary | ICD-10-CM

## 2013-08-05 DIAGNOSIS — R0902 Hypoxemia: Secondary | ICD-10-CM | POA: Diagnosis present

## 2013-08-05 DIAGNOSIS — H9190 Unspecified hearing loss, unspecified ear: Secondary | ICD-10-CM | POA: Diagnosis present

## 2013-08-05 DIAGNOSIS — D62 Acute posthemorrhagic anemia: Secondary | ICD-10-CM | POA: Diagnosis present

## 2013-08-05 DIAGNOSIS — Z923 Personal history of irradiation: Secondary | ICD-10-CM

## 2013-08-05 DIAGNOSIS — I10 Essential (primary) hypertension: Secondary | ICD-10-CM | POA: Diagnosis present

## 2013-08-05 DIAGNOSIS — Z7902 Long term (current) use of antithrombotics/antiplatelets: Secondary | ICD-10-CM

## 2013-08-05 DIAGNOSIS — C659 Malignant neoplasm of unspecified renal pelvis: Secondary | ICD-10-CM | POA: Diagnosis present

## 2013-08-05 DIAGNOSIS — I509 Heart failure, unspecified: Secondary | ICD-10-CM | POA: Diagnosis present

## 2013-08-05 DIAGNOSIS — Z85820 Personal history of malignant melanoma of skin: Secondary | ICD-10-CM

## 2013-08-05 DIAGNOSIS — E876 Hypokalemia: Secondary | ICD-10-CM | POA: Diagnosis present

## 2013-08-05 DIAGNOSIS — D509 Iron deficiency anemia, unspecified: Secondary | ICD-10-CM | POA: Diagnosis present

## 2013-08-05 DIAGNOSIS — E860 Dehydration: Secondary | ICD-10-CM | POA: Diagnosis present

## 2013-08-05 DIAGNOSIS — J438 Other emphysema: Secondary | ICD-10-CM | POA: Diagnosis present

## 2013-08-05 DIAGNOSIS — Z8546 Personal history of malignant neoplasm of prostate: Secondary | ICD-10-CM

## 2013-08-05 DIAGNOSIS — N182 Chronic kidney disease, stage 2 (mild): Secondary | ICD-10-CM | POA: Diagnosis present

## 2013-08-05 DIAGNOSIS — D649 Anemia, unspecified: Secondary | ICD-10-CM | POA: Diagnosis present

## 2013-08-05 DIAGNOSIS — Z8673 Personal history of transient ischemic attack (TIA), and cerebral infarction without residual deficits: Secondary | ICD-10-CM

## 2013-08-05 LAB — URINALYSIS, ROUTINE W REFLEX MICROSCOPIC
Bilirubin Urine: NEGATIVE
Glucose, UA: NEGATIVE mg/dL
KETONES UR: NEGATIVE mg/dL
Nitrite: POSITIVE — AB
Protein, ur: >300 mg/dL — AB
SPECIFIC GRAVITY, URINE: 1.013 (ref 1.005–1.030)
UROBILINOGEN UA: 1 mg/dL (ref 0.0–1.0)
pH: 7 (ref 5.0–8.0)

## 2013-08-05 LAB — COMPREHENSIVE METABOLIC PANEL
ALBUMIN: 3.3 g/dL — AB (ref 3.5–5.2)
ALT: 16 U/L (ref 0–53)
AST: 23 U/L (ref 0–37)
Alkaline Phosphatase: 68 U/L (ref 39–117)
BILIRUBIN TOTAL: 0.6 mg/dL (ref 0.3–1.2)
BUN: 30 mg/dL — ABNORMAL HIGH (ref 6–23)
CALCIUM: 11.1 mg/dL — AB (ref 8.4–10.5)
CO2: 28 mEq/L (ref 19–32)
CREATININE: 1.67 mg/dL — AB (ref 0.50–1.35)
Chloride: 93 mEq/L — ABNORMAL LOW (ref 96–112)
GFR calc Af Amer: 43 mL/min — ABNORMAL LOW (ref >90)
GFR calc non Af Amer: 37 mL/min — ABNORMAL LOW (ref >90)
Glucose, Bld: 137 mg/dL — ABNORMAL HIGH (ref 70–99)
Potassium: 3.4 mEq/L — ABNORMAL LOW (ref 3.7–5.3)
Sodium: 137 mEq/L (ref 137–147)
Total Protein: 7.1 g/dL (ref 6.0–8.3)

## 2013-08-05 LAB — D-DIMER, QUANTITATIVE (NOT AT ARMC): D DIMER QUANT: 0.85 ug{FEU}/mL — AB (ref 0.00–0.48)

## 2013-08-05 LAB — POCT I-STAT, CHEM 8
BUN: 31 mg/dL — AB (ref 6–23)
CALCIUM ION: 1.41 mmol/L — AB (ref 1.13–1.30)
CREATININE: 1.6 mg/dL — AB (ref 0.50–1.35)
Chloride: 97 mEq/L (ref 96–112)
Glucose, Bld: 132 mg/dL — ABNORMAL HIGH (ref 70–99)
HCT: 34 % — ABNORMAL LOW (ref 39.0–52.0)
Hemoglobin: 11.6 g/dL — ABNORMAL LOW (ref 13.0–17.0)
Potassium: 3.3 mEq/L — ABNORMAL LOW (ref 3.7–5.3)
Sodium: 137 mEq/L (ref 137–147)
TCO2: 28 mmol/L (ref 0–100)

## 2013-08-05 LAB — CBC WITH DIFFERENTIAL/PLATELET
BASOS ABS: 0.1 10*3/uL (ref 0.0–0.1)
BASOS PCT: 1 % (ref 0–1)
EOS ABS: 0.4 10*3/uL (ref 0.0–0.7)
EOS PCT: 4 % (ref 0–5)
HEMATOCRIT: 31.3 % — AB (ref 39.0–52.0)
HEMOGLOBIN: 10 g/dL — AB (ref 13.0–17.0)
Lymphocytes Relative: 26 % (ref 12–46)
Lymphs Abs: 2.9 10*3/uL (ref 0.7–4.0)
MCH: 30 pg (ref 26.0–34.0)
MCHC: 31.9 g/dL (ref 30.0–36.0)
MCV: 94 fL (ref 78.0–100.0)
MONO ABS: 1 10*3/uL (ref 0.1–1.0)
MONOS PCT: 8 % (ref 3–12)
Neutro Abs: 6.9 10*3/uL (ref 1.7–7.7)
Neutrophils Relative %: 61 % (ref 43–77)
Platelets: 514 10*3/uL — ABNORMAL HIGH (ref 150–400)
RBC: 3.33 MIL/uL — ABNORMAL LOW (ref 4.22–5.81)
RDW: 13.7 % (ref 11.5–15.5)
WBC: 11.3 10*3/uL — ABNORMAL HIGH (ref 4.0–10.5)

## 2013-08-05 LAB — POCT I-STAT TROPONIN I: Troponin i, poc: 0.02 ng/mL (ref 0.00–0.08)

## 2013-08-05 LAB — URINE MICROSCOPIC-ADD ON

## 2013-08-05 LAB — PRO B NATRIURETIC PEPTIDE: Pro B Natriuretic peptide (BNP): 261.9 pg/mL (ref 0–450)

## 2013-08-05 MED ORDER — POTASSIUM GLUCONATE 595 (99 K) MG PO TABS: 595.0000 mg | ORAL_TABLET | Freq: Every morning | ORAL | Status: DC

## 2013-08-05 MED ORDER — METHYLPREDNISOLONE SODIUM SUCC 125 MG IJ SOLR
125.0000 mg | Freq: Once | INTRAMUSCULAR | Status: AC
  Administered 2013-08-05: 125 mg via INTRAVENOUS
  Filled 2013-08-05: qty 2

## 2013-08-05 MED ORDER — ADULT MULTIVITAMIN W/MINERALS CH
1.0000 | ORAL_TABLET | Freq: Every morning | ORAL | Status: DC
  Administered 2013-08-06 – 2013-08-11 (×5): 1 via ORAL
  Filled 2013-08-05 (×6): qty 1

## 2013-08-05 MED ORDER — SENNOSIDES-DOCUSATE SODIUM 8.6-50 MG PO TABS
1.0000 | ORAL_TABLET | Freq: Every day | ORAL | Status: DC
  Administered 2013-08-05 – 2013-08-06 (×2): 1 via ORAL
  Filled 2013-08-05 (×4): qty 1

## 2013-08-05 MED ORDER — SODIUM CHLORIDE 0.9 % IV SOLN
INTRAVENOUS | Status: AC
  Administered 2013-08-05: 19:00:00 via INTRAVENOUS

## 2013-08-05 MED ORDER — FLUTICASONE PROPIONATE 50 MCG/ACT NA SUSP
2.0000 | Freq: Every day | NASAL | Status: DC
  Administered 2013-08-06 – 2013-08-09 (×4): 2 via NASAL
  Filled 2013-08-05: qty 16

## 2013-08-05 MED ORDER — ALBUTEROL SULFATE (2.5 MG/3ML) 0.083% IN NEBU: 2.5000 mg | INHALATION_SOLUTION | RESPIRATORY_TRACT | Status: DC | PRN

## 2013-08-05 MED ORDER — ACETAMINOPHEN 650 MG RE SUPP: 650.0000 mg | Freq: Four times a day (QID) | RECTAL | Status: DC | PRN

## 2013-08-05 MED ORDER — LORATADINE 10 MG PO TABS
10.0000 mg | ORAL_TABLET | Freq: Every morning | ORAL | Status: DC
  Administered 2013-08-06 – 2013-08-11 (×5): 10 mg via ORAL
  Filled 2013-08-05 (×6): qty 1

## 2013-08-05 MED ORDER — ACETAMINOPHEN 325 MG PO TABS: 650.0000 mg | ORAL_TABLET | Freq: Four times a day (QID) | ORAL | Status: DC | PRN

## 2013-08-05 MED ORDER — ONDANSETRON HCL 4 MG PO TABS: 4.0000 mg | ORAL_TABLET | Freq: Four times a day (QID) | ORAL | Status: DC | PRN

## 2013-08-05 MED ORDER — SODIUM CHLORIDE 0.9 % IV BOLUS (SEPSIS)
1000.0000 mL | Freq: Once | INTRAVENOUS | Status: AC
  Administered 2013-08-05: 1000 mL via INTRAVENOUS

## 2013-08-05 MED ORDER — ENOXAPARIN SODIUM 40 MG/0.4ML ~~LOC~~ SOLN
40.0000 mg | SUBCUTANEOUS | Status: DC
  Administered 2013-08-05 – 2013-08-06 (×2): 40 mg via SUBCUTANEOUS
  Filled 2013-08-05 (×3): qty 0.4

## 2013-08-05 MED ORDER — IPRATROPIUM-ALBUTEROL 0.5-2.5 (3) MG/3ML IN SOLN
3.0000 mL | Freq: Once | RESPIRATORY_TRACT | Status: AC
  Administered 2013-08-05: 3 mL via RESPIRATORY_TRACT
  Filled 2013-08-05: qty 3

## 2013-08-05 MED ORDER — PHENAZOPYRIDINE HCL 200 MG PO TABS
200.0000 mg | ORAL_TABLET | Freq: Three times a day (TID) | ORAL | Status: DC | PRN
  Filled 2013-08-05: qty 1

## 2013-08-05 MED ORDER — ONDANSETRON HCL 4 MG/2ML IJ SOLN: 4.0000 mg | Freq: Four times a day (QID) | INTRAMUSCULAR | Status: DC | PRN

## 2013-08-05 MED ORDER — FINASTERIDE 5 MG PO TABS
5.0000 mg | ORAL_TABLET | Freq: Every morning | ORAL | Status: DC
  Administered 2013-08-06 – 2013-08-11 (×5): 5 mg via ORAL
  Filled 2013-08-05 (×6): qty 1

## 2013-08-05 MED ORDER — CLOPIDOGREL BISULFATE 75 MG PO TABS
75.0000 mg | ORAL_TABLET | Freq: Every day | ORAL | Status: DC
  Administered 2013-08-06 – 2013-08-09 (×4): 75 mg via ORAL
  Filled 2013-08-05 (×8): qty 1

## 2013-08-05 MED ORDER — POTASSIUM CHLORIDE CRYS ER 20 MEQ PO TBCR
20.0000 meq | EXTENDED_RELEASE_TABLET | Freq: Once | ORAL | Status: AC
Start: 2013-08-05 — End: 2013-08-05
  Administered 2013-08-05: 20 meq via ORAL
  Filled 2013-08-05: qty 1

## 2013-08-05 MED ORDER — SODIUM CHLORIDE 0.9 % IJ SOLN
3.0000 mL | Freq: Two times a day (BID) | INTRAMUSCULAR | Status: DC
  Administered 2013-08-06 – 2013-08-11 (×7): 3 mL via INTRAVENOUS

## 2013-08-05 MED ORDER — AMLODIPINE BESYLATE 5 MG PO TABS
5.0000 mg | ORAL_TABLET | Freq: Every morning | ORAL | Status: DC
  Administered 2013-08-06 – 2013-08-08 (×3): 5 mg via ORAL
  Filled 2013-08-05 (×3): qty 1

## 2013-08-05 MED ORDER — IOHEXOL 350 MG/ML SOLN
80.0000 mL | Freq: Once | INTRAVENOUS | Status: AC | PRN
  Administered 2013-08-05: 80 mL via INTRAVENOUS

## 2013-08-05 MED ORDER — SIMVASTATIN 20 MG PO TABS
20.0000 mg | ORAL_TABLET | Freq: Every day | ORAL | Status: DC
Start: 2013-08-05 — End: 2013-08-11
  Administered 2013-08-05 – 2013-08-10 (×6): 20 mg via ORAL
  Filled 2013-08-05 (×8): qty 1

## 2013-08-05 MED ORDER — LOSARTAN POTASSIUM 50 MG PO TABS
100.0000 mg | ORAL_TABLET | Freq: Every day | ORAL | Status: DC
  Administered 2013-08-06 – 2013-08-07 (×2): 100 mg via ORAL
  Filled 2013-08-05 (×3): qty 2

## 2013-08-05 MED ORDER — OXYCODONE-ACETAMINOPHEN 5-325 MG PO TABS: 1.0000 | ORAL_TABLET | Freq: Three times a day (TID) | ORAL | Status: DC | PRN

## 2013-08-05 MED ORDER — TAMSULOSIN HCL 0.4 MG PO CAPS
0.4000 mg | ORAL_CAPSULE | Freq: Every day | ORAL | Status: DC
  Administered 2013-08-06 – 2013-08-10 (×6): 0.4 mg via ORAL
  Filled 2013-08-05 (×8): qty 1

## 2013-08-05 MED ORDER — POLYVINYL ALCOHOL 1.4 % OP SOLN
2.0000 [drp] | Freq: Every day | OPHTHALMIC | Status: DC | PRN
  Filled 2013-08-05: qty 15

## 2013-08-05 NOTE — ED Notes (Signed)
Walked pt to door O2 went to 89, by the time we sat back in bed it went to 87

## 2013-08-05 NOTE — ED Provider Notes (Signed)
CSN: [id number]     Arrival date & time 08/05/13  1302 History   First MD Initiated Contact with Patient 08/05/13 1335     Chief Complaint  Patient presents with  . Shortness of Breath   (Consider location/radiation/quality/duration/timing/severity/associated sxs/prior Treatment) The history is provided by the patient.  Jesse Owen is a 80 y.o. male hx of HTN, HL, TIA, melanoma, L kidney cancer here with shortness of breath. Shortness of breath and lightheadedness for the last 3 days. He noticed that he gets very short of breath with minimal exertion. Denies shortness of breath when laying down and no history of heart failure. Denies any chest pain. Denies any nausea vomiting or abdominal pain. Denies any fevers or chills.    Past Medical History  Diagnosis Date  . Hypertension   . Hyperlipemia   . TIA (transient ischemic attack)   . Hearing loss     left ear   . Prostate cancer     tx. radiation- greater than 10 yrs  . H/O seasonal allergies     mild hayfever  . Cataracts, bilateral   . UTI (urinary tract infection) 07-13-13    at present to start antibiotic,not yet started or picked up from pharmacy  . Hematuria 07/15/2013   Past Surgical History  Procedure Laterality Date  . Melanoma excision    . Cystoscopy with retrograde pyelogram, ureteroscopy and stent placement Left 07/15/2013    Procedure: CYSTOSCOPY WITH BILATERAL RETROGRADE PYELOGRAM, URETEROSCOPY AND LEFT URETERA LDILATION STENT PLACEMENT AND  RENAL MASS BIOPSY;  Surgeon: Molli Hazard, MD;  Location: [facility];  Service: Urology;  Laterality: Left;   No family history on file. History  Substance Use Topics  . Smoking status: Never Smoker   . Smokeless tobacco: Not on file  . Alcohol Use: No    Review of Systems  Respiratory: Positive for shortness of breath.   All other systems reviewed and are negative.    Allergies  Review of patient's allergies indicates no known allergies.  Home  Medications   Current Outpatient Rx  Name  Route  Sig  Dispense  Refill  . amLODipine (NORVASC) 10 MG tablet   Oral   Take 5 mg by mouth every morning.          . Calcium Carb-Cholecalciferol (CALCIUM 600 + D PO)   Oral   Take 1 tablet by mouth 2 (two) times daily.         . clopidogrel (PLAVIX) 75 MG tablet   Oral   Take 75 mg by mouth daily with breakfast.          . finasteride (PROSCAR) 5 MG tablet   Oral   Take 5 mg by mouth every morning.          . flunisolide (NASAREL) 29 MCG/ACT (0.025%) nasal spray   Nasal   Place 2 sprays into the nose daily as needed for rhinitis or allergies. Dose is for each nostril.         . furosemide (LASIX) 40 MG tablet   Oral   Take 40 mg by mouth every morning.          . hydrochlorothiazide (HYDRODIURIL) 25 MG tablet   Oral   Take 25 mg by mouth every morning.          . hydrocortisone cream 1 %   Topical   Apply 1 application topically 2 (two) times daily as needed (Applies to back for itching.).         Marland Kitchen  loratadine (CLARITIN) 10 MG tablet   Oral   Take 10 mg by mouth every morning.          Marland Kitchen losartan (COZAAR) 100 MG tablet   Oral   Take 100 mg by mouth daily with breakfast.          . Misc Natural Products (OSTEO BI-FLEX JOINT SHIELD PO)   Oral   Take 1 tablet by mouth 2 (two) times daily.          . Multiple Vitamin (MULTIVITAMIN WITH MINERALS) TABS tablet   Oral   Take 1 tablet by mouth every morning.          Marland Kitchen oxyCODONE-acetaminophen (PERCOCET/ROXICET) 5-325 MG per tablet   Oral   Take 1-2 tablets by mouth every 4 (four) hours as needed for severe pain.          . phenazopyridine (PYRIDIUM) 200 MG tablet   Oral   Take 1 tablet (200 mg total) by mouth 3 (three) times daily as needed for pain.   30 tablet   6   . polyvinyl alcohol (LIQUIFILM TEARS) 1.4 % ophthalmic solution   Both Eyes   Place 2 drops into both eyes daily as needed for dry eyes.         . potassium gluconate 595  MG TABS tablet   Oral   Take 595 mg by mouth every morning.          . sennosides-docusate sodium (SENOKOT-S) 8.6-50 MG tablet   Oral   Take 1 tablet by mouth at bedtime.         . simvastatin (ZOCOR) 40 MG tablet   Oral   Take 20 mg by mouth at bedtime.         . tamsulosin (FLOMAX) 0.4 MG CAPS capsule   Oral   Take 1 capsule (0.4 mg total) by mouth at bedtime.   30 capsule   3    BP 134/77  Temp(Src) 98.1 F (36.7 C) (Oral)  Resp 16  SpO2 95% Physical Exam  Nursing note and vitals reviewed. Constitutional: He is oriented to person, place, and time.  Chronically ill, tachypneic   HENT:  Head: Normocephalic.  Mouth/Throat: Oropharynx is clear and moist.  Eyes: Conjunctivae are normal. Pupils are equal, round, and reactive to light.  Neck: Normal range of motion. Neck supple.  Cardiovascular: Normal rate, regular rhythm and normal heart sounds.   Pulmonary/Chest:  Tachypneic, diminished breath sounds bilateral bases no wheezing   Abdominal: Soft. Bowel sounds are normal. He exhibits no distension. There is no tenderness. There is no rebound.  Musculoskeletal: Normal range of motion.  1+ edema bilateral legs   Neurological: He is alert and oriented to person, place, and time.  Skin: Skin is warm and dry.  Psychiatric: He has a normal mood and affect. His behavior is normal. Judgment and thought content normal.    ED Course  Procedures (including critical care time) Labs Review Labs Reviewed  CBC WITH DIFFERENTIAL - Abnormal; Notable for the following:    WBC 11.3 (*)    RBC 3.33 (*)    Hemoglobin 10.0 (*)    HCT 31.3 (*)    Platelets 514 (*)    All other components within normal limits  COMPREHENSIVE METABOLIC PANEL - Abnormal; Notable for the following:    Potassium 3.4 (*)    Chloride 93 (*)    Glucose, Bld 137 (*)    BUN 30 (*)    Creatinine, Ser  1.67 (*)    Calcium 11.1 (*)    Albumin 3.3 (*)    GFR calc non Af Amer 37 (*)    GFR calc Af Amer 43  (*)    All other components within normal limits  URINALYSIS, ROUTINE W REFLEX MICROSCOPIC - Abnormal; Notable for the following:    Color, Urine ORANGE (*)    Hgb urine dipstick LARGE (*)    Protein, ur >300 (*)    Nitrite POSITIVE (*)    Leukocytes, UA SMALL (*)    All other components within normal limits  D-DIMER, QUANTITATIVE - Abnormal; Notable for the following:    D-Dimer, Quant 0.85 (*)    All other components within normal limits  URINE MICROSCOPIC-ADD ON - Abnormal; Notable for the following:    Casts HYALINE CASTS (*)    All other components within normal limits  POCT I-STAT, CHEM 8 - Abnormal; Notable for the following:    Potassium 3.3 (*)    BUN 31 (*)    Creatinine, Ser 1.60 (*)    Glucose, Bld 132 (*)    Calcium, Ion 1.41 (*)    Hemoglobin 11.6 (*)    HCT 34.0 (*)    All other components within normal limits  PRO B NATRIURETIC PEPTIDE  POCT I-STAT TROPONIN I   Imaging Review Dg Chest 2 View  08/05/2013   CLINICAL DATA:  Shortness of breath  EXAM: CHEST  2 VIEW  COMPARISON:  None.  FINDINGS: AP and lateral views of the chest show no focal airspace consolidation. No evidence for pulmonary edema or pneumothorax. No pleural effusion. Basilar atelectasis is evident. The cardiopericardial silhouette is within normal limits for size. Telemetry leads overlie the chest.  IMPRESSION: Bibasilar atelectasis without edema or focal lung consolidation.   Electronically Signed   By: Misty Stanley M.D.   On: 08/05/2013 14:06   Ct Angio Chest Pe W/cm &/or Wo Cm  08/05/2013   CLINICAL DATA:  Shortness of Breath ; history of prostate carcinoma  EXAM: CT ANGIOGRAPHY CHEST WITH CONTRAST  TECHNIQUE: Multidetector CT imaging of the chest was performed using the standard protocol during bolus administration of intravenous contrast. Multiplanar CT image reconstructions including MIPs were obtained to evaluate the vascular anatomy.  CONTRAST:  70 mL OMNIPAQUE IOHEXOL 350 MG/ML SOLN  COMPARISON:   Chest CT December 18, 2008 and chest radiograph August 05, 2013  FINDINGS: There is no demonstrable pulmonary embolus. There is no thoracic aortic aneurysm or dissection.  There is underlying centrilobular emphysema. There are areas of interstitial fibrosis in the periphery of both upper and lower lobes which has become more pronounced compared to the previous study. There does appear to be superimposed interstitial edema in the lung bases. There is a small area of airspace consolidation in the inferior lingula posteriorly.  A previously noted 2 mm nodular opacity in the right middle lobe remains stable.  There is no appreciable thoracic adenopathy. The pericardium is not thickened.  In the visualized upper abdomen, there is a probable granuloma in the anterior segment right lobe. There is a 1 cm cyst in the medial segment left lobe, stable. An 8 mm cyst in the anterior segment of the right lobe is stable as well. Visualized upper abdominal structures otherwise appear normal.  There are no blastic or lytic bone lesions.  Thyroid appears normal.  Review of the MIP images confirms the above findings.  IMPRESSION: Underlying emphysema with areas of interstitial fibrosis, progressed from 2010. Evidence of  bibasilar edema; question a degree of underlying congestive heart failure superimposed on emphysematous change. Small area of infiltrate in the inferior lingula, either representing alveolar edema or possibly small focus of pneumonia.  No demonstrable pulmonary embolus.  Stable 2 mm nodular opacity on the right. Stability since 2010 is consistent with benign etiology.   Electronically Signed   By: Lowella Grip M.D.   On: 08/05/2013 15:02    EKG Interpretation    Date/Time:  Wednesday August 05 2013 13:05:57 EST Ventricular Rate:  99 PR Interval:  219 QRS Duration: 87 QT Interval:  332 QTC Calculation: 426 R Axis:   20 Text Interpretation:  sinus tachycardia, unchanged from previous  Confirmed by Suheyla Mortellaro   MD, Rosha Cocker [phone]) on 08/05/2013 2:43:23 PM            MDM  No diagnosis found. Irish JAMAREA SELNER is a 80 y.o. male hx of multiple cancers, HTN here with SOB on exertion. Consider new onset CHF vs unstable angina vs PE vs pneumonia. Will get CT angio chest, labs, BNP. Will likely need admission for further workup.   3:11 PM Patient ambulated and pulse Ox dropped to 87%. CT showed no PE. He has emphysema with interstitial fibrosis. No hx of COPD and not a smoker. I ordered solumedrol. No wheezing but given emphysema on CT will give duoneb. Will admit for hypoxia, pulmonary fibrosis, emphysema, and acute on chronic renal failure.    Wandra Arthurs, MD 08/05/13 1535

## 2013-08-05 NOTE — ED Notes (Signed)
Patient presents today with a chief complaint of shortness of breath, lightheaded, near-syncope over past 3 days. Patient reports he gets very weak and short of breath upon ambulation to short distances. Patient has renal cancer and will get left kidney removed in March. Denies chest pain and shortness of breath at this time.

## 2013-08-05 NOTE — Consult Note (Signed)
Name: Jesse Owen MRN: [id number] DOB: 29-Oct-1929    ADMISSION DATE:  08/05/2013 CONSULTATION DATE:  08/05/13  REFERRING MD :  Dr. Algis Liming PRIMARY SERVICE:  TRH  CHIEF COMPLAINT:  Abnormal CT  BRIEF PATIENT DESCRIPTION: 80 y/o M, never smoker, admitted on 2/4 with progressive shortness of breath.    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  Patient describes episodic SOB with activity over the past couple of months with decreased activity tolerance. He notes over the past couple of years he has not been as active.  Since 1/30 he has had a significant increase in SOB with activity.  He relays he can only walk short distances (approx 25 ft) before he feels short of breath, lightheaded and "his heart is running away".  He experienced an episode on 2/1 where he had gone to the mailbox and upon coming back to the house, he collapsed on the couch because of dyspnea.  Patient denies chest pain, pain with inspiration, syncope, n/v/d, headaches, vision changes, abdominal pain.  He endorses dark stool, mild increase in LE swelling for which he wears compression stockings and occ cough with white sputum production.   ER work up noted K+ 3.3, Sr Cr 1.60, Hgb 11.6 (was 13.5 in 10/14/12), UA positive for UTI (+nitrate, small leukocytes, 3-6 wbc, rare bacteria), CXR with bibasilar atx without focal infiltrate, and CTA scan of chest neg for PE, notable for emphysema with small areas of interstitial fibrosis, small / stable pulmonary nodule and question of edema.  PAST MEDICAL HISTORY :  Past Medical History  Diagnosis Date  . Hypertension   .  Hyperlipemia   . TIA (transient ischemic attack)   . Hearing loss     left ear   . Prostate cancer     tx. radiation- greater than 10 yrs  . H/O seasonal allergies     mild hayfever  . Cataracts, bilateral   . UTI (urinary tract infection) 07-13-13    at present to start antibiotic,not yet started or picked up from pharmacy  . Hematuria 07/15/2013   Past Surgical History  Procedure Laterality Date  . Melanoma excision    . Cystoscopy with retrograde pyelogram, ureteroscopy and stent placement Left 07/15/2013    Procedure: CYSTOSCOPY WITH BILATERAL RETROGRADE PYELOGRAM, URETEROSCOPY AND LEFT URETERA LDILATION STENT PLACEMENT AND  RENAL MASS BIOPSY;  Surgeon: Molli Hazard, MD;  Location: [facility];  Service: Urology;  Laterality: Left;   Prior to Admission medications   Medication Sig Start Date End Date Taking? Authorizing Provider  amLODipine (NORVASC) 10 MG tablet Take 5 mg by mouth every morning.    Yes Historical Provider, MD  Calcium Carb-Cholecalciferol (CALCIUM 600 + D PO) Take 1 tablet by mouth 2 (two) times daily.   Yes Historical Provider, MD  clopidogrel (PLAVIX) 75 MG tablet Take 75 mg by mouth daily with breakfast.    Yes Historical Provider, MD  finasteride (PROSCAR) 5 MG tablet Take 5 mg by mouth every morning.    Yes Historical Provider, MD  flunisolide (NASAREL) 29 MCG/ACT (0.025%) nasal spray Place 2 sprays into the nose daily as needed  for rhinitis or allergies. Dose is for each nostril.   Yes Historical Provider, MD  furosemide (LASIX) 40 MG tablet Take 40 mg by mouth every morning.    Yes Historical Provider, MD  hydrochlorothiazide (HYDRODIURIL) 25 MG tablet Take 25 mg by mouth every morning.    Yes Historical Provider, MD  hydrocortisone cream 1 % Apply 1 application topically 2 (two) times daily as needed (Applies to back for itching.).   Yes Historical Provider, MD  loratadine (CLARITIN) 10 MG tablet Take 10 mg by mouth every morning.    Yes Historical  Provider, MD  losartan (COZAAR) 100 MG tablet Take 100 mg by mouth daily with breakfast.    Yes Historical Provider, MD  Misc Natural Products (OSTEO BI-FLEX JOINT SHIELD PO) Take 1 tablet by mouth 2 (two) times daily.    Yes Historical Provider, MD  Multiple Vitamin (MULTIVITAMIN WITH MINERALS) TABS tablet Take 1 tablet by mouth every morning.    Yes Historical Provider, MD  oxyCODONE-acetaminophen (PERCOCET/ROXICET) 5-325 MG per tablet Take 1-2 tablets by mouth every 4 (four) hours as needed for severe pain.    Yes Historical Provider, MD  phenazopyridine (PYRIDIUM) 200 MG tablet Take 1 tablet (200 mg total) by mouth 3 (three) times daily as needed for pain. 07/15/13  Yes Molli Hazard, MD  polyvinyl alcohol (LIQUIFILM TEARS) 1.4 % ophthalmic solution Place 2 drops into both eyes daily as needed for dry eyes.   Yes Historical Provider, MD  potassium gluconate 595 MG TABS tablet Take 595 mg by mouth every morning.    Yes Historical Provider, MD  sennosides-docusate sodium (SENOKOT-S) 8.6-50 MG tablet Take 1 tablet by mouth at bedtime.   Yes Historical Provider, MD  simvastatin (ZOCOR) 40 MG tablet Take 20 mg by mouth at bedtime.   Yes Historical Provider, MD  tamsulosin (FLOMAX) 0.4 MG CAPS capsule Take 1 capsule (0.4 mg total) by mouth at bedtime. 07/15/13  Yes Molli Hazard, MD   No Known Allergies  FAMILY HISTORY:  History reviewed. No pertinent family history. Mother lived to be 22.   SOCIAL HISTORY:  reports that he has never smoked. He has never used smokeless tobacco. He reports that he does not drink alcohol or use illicit drugs. 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  Worked at a [employer], contracting in the office.  REVIEW OF SYSTEMS:   Constitutional: Negative for fever, chills, weight loss, malaise/fatigue and diaphoresis.  HENT: Negative for hearing loss, ear pain, nosebleeds, congestion, sore throat, neck pain, tinnitus and ear discharge.   Eyes: Negative for  blurred vision, double vision, photophobia, pain, discharge and redness.  Respiratory: Negative for hemoptysis, wheezing and stridor.  Reports occ cough with minimal white sputum production.  +SOB/DOE.   Cardiovascular: Negative for chest pain, orthopnea, claudication, and PND. + for palpitations, LE swelling.   Gastrointestinal: Negative for heartburn, nausea, vomiting, abdominal pain, diarrhea, constipation, blood in stool and melena.  Genitourinary: Negative for dysuria, urgency, frequency, hematuria and flank pain.  Musculoskeletal: Negative for myalgias, back pain, joint pain and falls.  Skin: Negative for itching and rash.  Neurological: Negative for dizziness, tingling, tremors, sensory change, speech change, focal weakness, seizures, loss of consciousness, and headaches. +for weakness & lightheadedness Endo/Heme/Allergies: Negative for environmental allergies and polydipsia. Does not bruise/bleed easily.  SUBJECTIVE:   VITAL SIGNS: Temp:  [98.1 F (36.7 C)-98.2 F (36.8 C)] 98.1 F (36.7 C) (02/04 1639) Pulse Rate:  [99-140] 140 (02/04 1711) Resp:  [16-20]  20 (02/04 1639) BP: (117-149)/(63-87) 117/63 mmHg (02/04 1711) SpO2:  [94 %-95 %] 95 % (02/04 1708) Weight:  [173 lb 8 oz (78.699 kg)] 173 lb 8 oz (78.699 kg) (02/04 1639)  PHYSICAL EXAMINATION: General:  wdwn elderly male in NAD Neuro:  AAOx4, speech clear, MAE HEENT:  Mm pale, moist.  No jvd Cardiovascular:  s1s2 with 3/6 murmur heard best at 2nd ICS RSB Lungs:  resp's even/non-labored, lungs bilaterally essentially clear, few faint basilar crackles Abdomen:  Obese/soft, bsx4 active Musculoskeletal:  No acute deformities Skin:  PALE, warm/dry.    Recent Labs Lab 08/05/13 1320 08/05/13 1425  NA 137 137  K 3.4* 3.3*  CL 93* 97  CO2 28  --   BUN 30* 31*  CREATININE 1.67* 1.60*  GLUCOSE 137* 132*    Recent Labs Lab 08/05/13 1320 08/05/13 1425  HGB 10.0* 11.6*  HCT 31.3* 34.0*  WBC 11.3*  --   PLT 514*  --     Dg Chest 2 View  08/05/2013   CLINICAL DATA:  Shortness of breath  EXAM: CHEST  2 VIEW  COMPARISON:  None.  FINDINGS: AP and lateral views of the chest show no focal airspace consolidation. No evidence for pulmonary edema or pneumothorax. No pleural effusion. Basilar atelectasis is evident. The cardiopericardial silhouette is within normal limits for size. Telemetry leads overlie the chest.  IMPRESSION: Bibasilar atelectasis without edema or focal lung consolidation.   Electronically Signed   By: Misty Stanley M.D.   On: 08/05/2013 14:06   Ct Angio Chest Pe W/cm &/or Wo Cm  08/05/2013   CLINICAL DATA:  Shortness of Breath ; history of prostate carcinoma  EXAM: CT ANGIOGRAPHY CHEST WITH CONTRAST  TECHNIQUE: Multidetector CT imaging of the chest was performed using the standard protocol during bolus administration of intravenous contrast. Multiplanar CT image reconstructions including MIPs were obtained to evaluate the vascular anatomy.  CONTRAST:  70 mL OMNIPAQUE IOHEXOL 350 MG/ML SOLN  COMPARISON:  Chest CT December 18, 2008 and chest radiograph August 05, 2013  FINDINGS: There is no demonstrable pulmonary embolus. There is no thoracic aortic aneurysm or dissection.  There is underlying centrilobular emphysema. There are areas of interstitial fibrosis in the periphery of both upper and lower lobes which has become more pronounced compared to the previous study. There does appear to be superimposed interstitial edema in the lung bases. There is a small area of airspace consolidation in the inferior lingula posteriorly.  A previously noted 2 mm nodular opacity in the right middle lobe remains stable.  There is no appreciable thoracic adenopathy. The pericardium is not thickened.  In the visualized upper abdomen, there is a probable granuloma in the anterior segment right lobe. There is a 1 cm cyst in the medial segment left lobe, stable. An 8 mm cyst in the anterior segment of the right lobe is stable as well.  Visualized upper abdominal structures otherwise appear normal.  There are no blastic or lytic bone lesions.  Thyroid appears normal.  Review of the MIP images confirms the above findings.  IMPRESSION: Underlying emphysema with areas of interstitial fibrosis, progressed from 2010. Evidence of bibasilar edema; question a degree of underlying congestive heart failure superimposed on emphysematous change. Small area of infiltrate in the inferior lingula, either representing alveolar edema or possibly small focus of pneumonia.  No demonstrable pulmonary embolus.  Stable 2 mm nodular opacity on the right. Stability since 2010 is consistent with benign etiology.   Electronically Signed  By: Lowella Grip M.D.   On: 08/05/2013 15:02    ASSESSMENT / PLAN:  Dyspnea  Hypoxemia Anemia L Renal Pelvic Tumor - awaiting nephrectomy HTN Murmur R/O CHF   81 y/o, never smoker, with emphysema and mild areas of fibrosis on CTA chest. Definite progression compared with prior CT scan 12/08/08.  Dyspnea on exertion with acute worsening prior to admit.  He has had a noted drop in hgb over the past few years from 15 in 2012 to 47 in 2015 + endorses dark stool.  On exam he has noted murmur (new) and endorses subjective palpitations/heart racing & LE edema as well as noted HR up to 140's during admit.  DDX includes all of the above, but timing he describes is suspicious for acute anemia following his renal bx.   Plan: -assess ECHO for EF & rule out valvular disease -FOB for hemoccult -autoimmune labs -will need PFT and Pulm f/u as an outpt >> wants to see Dr Melvyn Novas 08/18/13 -assess ambulatory desaturation prior to discharge -send iron studies -no role for steroids -hold abx for now -assess orthostatics  -oxygen as needed to keep sats > 92% -continue telemetry for now    Noe Gens, NP-C [place] Pgr: [phone] or [phone] 08/05/2013, 8:17 PM  Baltazar Apo, MD, PhD 08/06/2013, 12:13  PM [facility] [phone] or if no answer [phone]

## 2013-08-05 NOTE — H&P (Signed)
History and Physical  Jesse Owen DGU:[id number] DOB: 02/16/1930 DOA: 08/05/2013  Referring physician: EDP PCP: Vena Austria, MD  Outpatient Specialists:  1. Oncology: Dr. Zola Button 2. Urology: Dr. Rolan Bucco  Chief Complaint: Difficulty breathing  HPI: Jesse Owen is a 80 y.o. 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 left nephrectomy on 09/09/13 presented to the ED with complaints of worsening dyspnea. According to patient and spouse, no prior history of dyspnea although patient says that at times he might have had some dyspnea with activity. Patient underwent renal biopsy on 1/14 and since then has been progressively getting weaker and less active. 3-4 days back, patient started noticing dyspnea on exertion. This was not associated with fever, chills, cough, wheezing, chest tightness or chest pain. Dyspnea got worse last night and ambulating even 50-100 steps to and fro from his mailbox, made him significantly worse and he collapsed on a chair. There was no loss of consciousness. His wife made an appointment for him to see his PCP today but he had recurrence of same symptoms while retrieving male from his mailbox and decided to come to the ED. He does complain of some lightheadedness with activity. He is a lifelong nonsmoker and was exposed to passive smoke by his dad greater than 50 years ago. 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 He denies choking with solids or liquids but claims to have some difficulty swallowing at times. In the ED, potassium 3.3, creatinine 1.67, hemoglobin 10, d-dimer positive and CT chest was negative for PE but showed emphysema, areas of interstitial fibrosis worse than 2010 and questioned underlying CHF and small area of infiltrate  in left lingula. ProBNP was 262. Patient became hypoxic with oxygen saturation of 87% with minimal activity in the ED. He received a dose of Solu-Medrol and hospitalist admission was requested.   Review of Systems: All systems reviewed and apart from history of presenting illness, are negative.  Past Medical History  Diagnosis Date  . Hypertension   . Hyperlipemia   . TIA (transient ischemic attack)   . Hearing loss     left ear   . Prostate cancer     tx. radiation- greater than 10 yrs  . H/O seasonal allergies     mild hayfever  . Cataracts, bilateral   . UTI (urinary tract infection) 07-13-13    at present to start antibiotic,not yet started or picked up from pharmacy  . Hematuria 07/15/2013   Past Surgical History  Procedure Laterality Date  . Melanoma excision    . Cystoscopy with retrograde pyelogram, ureteroscopy and stent placement Left 07/15/2013    Procedure: CYSTOSCOPY WITH BILATERAL RETROGRADE PYELOGRAM, URETEROSCOPY AND LEFT URETERA LDILATION STENT PLACEMENT AND  RENAL MASS BIOPSY;  Surgeon: Molli Hazard, MD;  Location: [facility];  Service: Urology;  Laterality: Left;   Social History:  reports that he has never smoked. He has never used smokeless tobacco. He reports that he does not drink alcohol or use illicit drugs. Married. Independent of activities of daily living.  No Known Allergies  History reviewed. No pertinent family history. negative family history. Spouse apparently has asthma.  Prior to Admission medications   Medication Sig Start Date End Date Taking? Authorizing Provider  amLODipine (NORVASC) 10 MG tablet Take 5 mg by mouth every  morning.    Yes Historical Provider, MD  Calcium Carb-Cholecalciferol (CALCIUM 600 + D PO) Take 1 tablet by mouth 2 (two) times daily.   Yes Historical Provider, MD  clopidogrel (PLAVIX) 75 MG tablet Take 75 mg by mouth daily with breakfast.    Yes Historical Provider, MD  finasteride (PROSCAR) 5 MG tablet Take 5 mg by  mouth every morning.    Yes Historical Provider, MD  flunisolide (NASAREL) 29 MCG/ACT (0.025%) nasal spray Place 2 sprays into the nose daily as needed for rhinitis or allergies. Dose is for each nostril.   Yes Historical Provider, MD  furosemide (LASIX) 40 MG tablet Take 40 mg by mouth every morning.    Yes Historical Provider, MD  hydrochlorothiazide (HYDRODIURIL) 25 MG tablet Take 25 mg by mouth every morning.    Yes Historical Provider, MD  hydrocortisone cream 1 % Apply 1 application topically 2 (two) times daily as needed (Applies to back for itching.).   Yes Historical Provider, MD  loratadine (CLARITIN) 10 MG tablet Take 10 mg by mouth every morning.    Yes Historical Provider, MD  losartan (COZAAR) 100 MG tablet Take 100 mg by mouth daily with breakfast.    Yes Historical Provider, MD  Misc Natural Products (OSTEO BI-FLEX JOINT SHIELD PO) Take 1 tablet by mouth 2 (two) times daily.    Yes Historical Provider, MD  Multiple Vitamin (MULTIVITAMIN WITH MINERALS) TABS tablet Take 1 tablet by mouth every morning.    Yes Historical Provider, MD  oxyCODONE-acetaminophen (PERCOCET/ROXICET) 5-325 MG per tablet Take 1-2 tablets by mouth every 4 (four) hours as needed for severe pain.    Yes Historical Provider, MD  phenazopyridine (PYRIDIUM) 200 MG tablet Take 1 tablet (200 mg total) by mouth 3 (three) times daily as needed for pain. 07/15/13  Yes Molli Hazard, MD  polyvinyl alcohol (LIQUIFILM TEARS) 1.4 % ophthalmic solution Place 2 drops into both eyes daily as needed for dry eyes.   Yes Historical Provider, MD  potassium gluconate 595 MG TABS tablet Take 595 mg by mouth every morning.    Yes Historical Provider, MD  sennosides-docusate sodium (SENOKOT-S) 8.6-50 MG tablet Take 1 tablet by mouth at bedtime.   Yes Historical Provider, MD  simvastatin (ZOCOR) 40 MG tablet Take 20 mg by mouth at bedtime.   Yes Historical Provider, MD  tamsulosin (FLOMAX) 0.4 MG CAPS capsule Take 1 capsule (0.4 mg  total) by mouth at bedtime. 07/15/13  Yes Molli Hazard, MD   Physical Exam: Filed Vitals:   08/05/13 1639 08/05/13 1704 08/05/13 1708 08/05/13 1711  BP: 129/84 137/80 144/81 117/63  Pulse: 115 109 109 140  Temp: 98.1 F (36.7 C)     TempSrc: Oral     Resp: 20     Height: 5\' 7"  (1.702 m)     Weight: 78.699 kg (173 lb 8 oz)     SpO2: 95% 94% 95%      General exam: Moderately built and nourished male patient, lying comfortably supine in bed in no obvious distress.  Head, eyes and ENT: Nontraumatic and normocephalic. Pupils equally reacting to light and accommodation. Oral mucosa borderline hydration.  Neck: Supple. No JVD, carotid bruit or thyromegaly.  Lymphatics: No lymphadenopathy.  Respiratory system: Occasional basal crackles but otherwise clear to auscultation. No wheezing or rhonchi. No increased work of breathing.  Cardiovascular system: S1 and S2 heard, RRR. No JVD, gallops, clicks or pedal edema. 2/6 systolic ejection murmur best heard at apex.  Gastrointestinal system: Abdomen is nondistended, soft and nontender. Normal bowel sounds heard. No organomegaly or masses appreciated.  Central nervous system: Alert and oriented. No focal neurological deficits.  Extremities: Symmetric 5 x 5 power. Peripheral pulses symmetrically felt.   Skin: No rashes or acute findings.  Musculoskeletal system: Negative exam.  Psychiatry: Pleasant and cooperative.   Labs on Admission:  Basic Metabolic Panel:  Recent Labs Lab 08/05/13 1320 08/05/13 1425  NA 137 137  K 3.4* 3.3*  CL 93* 97  CO2 28  --   GLUCOSE 137* 132*  BUN 30* 31*  CREATININE 1.67* 1.60*  CALCIUM 11.1*  --    Liver Function Tests:  Recent Labs Lab 08/05/13 1320  AST 23  ALT 16  ALKPHOS 68  BILITOT 0.6  PROT 7.1  ALBUMIN 3.3*   No results found for this basename: LIPASE, AMYLASE,  in the last 168 hours No results found for this basename: AMMONIA,  in the last 168 hours CBC:  Recent  Labs Lab 08/05/13 1320 08/05/13 1425  WBC 11.3*  --   NEUTROABS 6.9  --   HGB 10.0* 11.6*  HCT 31.3* 34.0*  MCV 94.0  --   PLT 514*  --    Cardiac Enzymes: No results found for this basename: CKTOTAL, CKMB, CKMBINDEX, TROPONINI,  in the last 168 hours  BNP (last 3 results)  Recent Labs  08/05/13 1320  PROBNP 261.9   CBG: No results found for this basename: GLUCAP,  in the last 168 hours  Radiological Exams on Admission: Dg Chest 2 View  08/05/2013   CLINICAL DATA:  Shortness of breath  EXAM: CHEST  2 VIEW  COMPARISON:  None.  FINDINGS: AP and lateral views of the chest show no focal airspace consolidation. No evidence for pulmonary edema or pneumothorax. No pleural effusion. Basilar atelectasis is evident. The cardiopericardial silhouette is within normal limits for size. Telemetry leads overlie the chest.  IMPRESSION: Bibasilar atelectasis without edema or focal lung consolidation.   Electronically Signed   By: Misty Stanley M.D.   On: 08/05/2013 14:06   Ct Angio Chest Pe W/cm &/or Wo Cm  08/05/2013   CLINICAL DATA:  Shortness of Breath ; history of prostate carcinoma  EXAM: CT ANGIOGRAPHY CHEST WITH CONTRAST  TECHNIQUE: Multidetector CT imaging of the chest was performed using the standard protocol during bolus administration of intravenous contrast. Multiplanar CT image reconstructions including MIPs were obtained to evaluate the vascular anatomy.  CONTRAST:  70 mL OMNIPAQUE IOHEXOL 350 MG/ML SOLN  COMPARISON:  Chest CT December 18, 2008 and chest radiograph August 05, 2013  FINDINGS: There is no demonstrable pulmonary embolus. There is no thoracic aortic aneurysm or dissection.  There is underlying centrilobular emphysema. There are areas of interstitial fibrosis in the periphery of both upper and lower lobes which has become more pronounced compared to the previous study. There does appear to be superimposed interstitial edema in the lung bases. There is a small area of airspace  consolidation in the inferior lingula posteriorly.  A previously noted 2 mm nodular opacity in the right middle lobe remains stable.  There is no appreciable thoracic adenopathy. The pericardium is not thickened.  In the visualized upper abdomen, there is a probable granuloma in the anterior segment right lobe. There is a 1 cm cyst in the medial segment left lobe, stable. An 8 mm cyst in the anterior segment of the right lobe is stable as well. Visualized upper abdominal structures otherwise appear normal.  There are no blastic or lytic bone lesions.  Thyroid appears normal.  Review of the MIP images confirms the above findings.  IMPRESSION: Underlying emphysema with areas of interstitial fibrosis, progressed from 2010. Evidence of bibasilar edema; question a degree of underlying congestive heart failure superimposed on emphysematous change. Small area of infiltrate in the inferior lingula, either representing alveolar edema or possibly small focus of pneumonia.  No demonstrable pulmonary embolus.  Stable 2 mm nodular opacity on the right. Stability since 2010 is consistent with benign etiology.   Electronically Signed   By: Lowella Grip M.D.   On: 08/05/2013 15:02    EKG: Independently reviewed. Sinus tachycardia at 99 beats per minute, normal axis and mild Q waves in inferior and lateral leads but no acute changes and not significantly changed compared to prior EKG in January 2015.  Assessment/Plan Principal Problem:   Acute respiratory failure with hypoxia Active Problems:   Interstitial pulmonary fibrosis   Emphysema lung   Hypokalemia   Anemia   Acute-on-chronic renal failure   Hypertension   Hyperlipemia   Acute hypoxic respiratory failure/interstitial pulmonary fibrosis/emphysema - Secondary to interstitial pulmonary fibrosis and emphysema seen on CT, of unclear etiology. Not sure if this was a progressive decline versus subacute worsening-no clear precipitant. No clinical features  consistent with pneumonia or decompensated CHF. Admit to telemetry for close monitoring. Will check 2-D echo to rule out valvular heart disease as cause of his dyspnea. Will check swallow evaluation to rule out silent aspiration. No clear indication for antibiotics, steroids or diuretics at this time-and hence will hold. Requested pulmonary consultation for assistance with evaluation, management & will need pre op clearance for upcoming urological surgery.  Acute on stage II chronic kidney disease - Likely secondary to diuretics and ARB. Temporarily hold diuretics. Patient received contrast during CTA chest this admission. Brief IV fluid hydration and monitor BMP in a.m. If creatinine is trending up or not improving, may consider holding ARB too.  Hypokalemia  - replete and follow BMP  Anemia  - hemoglobin has dropped from 13.1 on 1/12-10 g/dL. No overt bleeding. Follow CBC in a.m.  Hypertension  - Controlled. Continue amlodipine and ARB. Temporarily hold HCTZ.   Orthostatic hypotension - Brief IV fluid hydration and monitor  Left renal pelvic tumor (low-grade papillary urothelial carcinoma) - Awaiting surgery.     Code Status:  Full   Family Communication:  discussed with spouse at bedside   Disposition Plan: Home when medically stable    Time spent:  13 minutes   HONGALGI,ANAND, MD, FACP, FHM. [facility] Pager [phone]  If 7PM-7AM, please contact night-coverage [email or web address] Password [facility] 08/05/2013, 5:26 PM

## 2013-08-06 ENCOUNTER — Observation Stay (HOSPITAL_COMMUNITY): Payer: Medicare Other

## 2013-08-06 DIAGNOSIS — J9600 Acute respiratory failure, unspecified whether with hypoxia or hypercapnia: Secondary | ICD-10-CM

## 2013-08-06 DIAGNOSIS — I059 Rheumatic mitral valve disease, unspecified: Secondary | ICD-10-CM

## 2013-08-06 DIAGNOSIS — R0902 Hypoxemia: Secondary | ICD-10-CM

## 2013-08-06 DIAGNOSIS — J8410 Pulmonary fibrosis, unspecified: Secondary | ICD-10-CM

## 2013-08-06 LAB — BASIC METABOLIC PANEL
BUN: 35 mg/dL — ABNORMAL HIGH (ref 6–23)
CALCIUM: 9.2 mg/dL (ref 8.4–10.5)
CO2: 24 mEq/L (ref 19–32)
CREATININE: 1.7 mg/dL — AB (ref 0.50–1.35)
Chloride: 97 mEq/L (ref 96–112)
GFR calc Af Amer: 42 mL/min — ABNORMAL LOW (ref >90)
GFR, EST NON AFRICAN AMERICAN: 36 mL/min — AB (ref >90)
Glucose, Bld: 219 mg/dL — ABNORMAL HIGH (ref 70–99)
Potassium: 3.4 mEq/L — ABNORMAL LOW (ref 3.7–5.3)
Sodium: 135 mEq/L — ABNORMAL LOW (ref 137–147)

## 2013-08-06 LAB — RETICULOCYTES
RBC.: 2.95 MIL/uL — ABNORMAL LOW (ref 4.22–5.81)
RETIC CT PCT: 4 % — AB (ref 0.4–3.1)
Retic Count, Absolute: 118 10*3/uL (ref 19.0–186.0)

## 2013-08-06 LAB — CBC
HCT: 27.7 % — ABNORMAL LOW (ref 39.0–52.0)
Hemoglobin: 8.9 g/dL — ABNORMAL LOW (ref 13.0–17.0)
MCH: 30.2 pg (ref 26.0–34.0)
MCHC: 32.1 g/dL (ref 30.0–36.0)
MCV: 93.9 fL (ref 78.0–100.0)
PLATELETS: 434 10*3/uL — AB (ref 150–400)
RBC: 2.95 MIL/uL — AB (ref 4.22–5.81)
RDW: 13.8 % (ref 11.5–15.5)
WBC: 7.8 10*3/uL (ref 4.0–10.5)

## 2013-08-06 LAB — VITAMIN B12: VITAMIN B 12: 680 pg/mL (ref 211–911)

## 2013-08-06 LAB — FERRITIN: Ferritin: 514 ng/mL — ABNORMAL HIGH (ref 22–322)

## 2013-08-06 LAB — RHEUMATOID FACTOR: Rhuematoid fact SerPl-aCnc: <10 IU/mL (ref <=14)

## 2013-08-06 LAB — MAGNESIUM: MAGNESIUM: 1.7 mg/dL (ref 1.5–2.5)

## 2013-08-06 LAB — FOLATE: Folate: >20 ng/mL

## 2013-08-06 MED ORDER — POTASSIUM CHLORIDE CRYS ER 20 MEQ PO TBCR
30.0000 meq | EXTENDED_RELEASE_TABLET | Freq: Once | ORAL | Status: AC
  Administered 2013-08-06: 30 meq via ORAL
  Filled 2013-08-06: qty 1

## 2013-08-06 MED ORDER — SODIUM CHLORIDE 0.9 % IV SOLN
INTRAVENOUS | Status: DC
  Administered 2013-08-06: 10:00:00 via INTRAVENOUS

## 2013-08-06 MED ORDER — POTASSIUM GLUCONATE 595 (99 K) MG PO TABS
595.0000 mg | ORAL_TABLET | Freq: Every morning | ORAL | Status: DC
  Administered 2013-08-06 – 2013-08-09 (×4): 595 mg via ORAL
  Filled 2013-08-06 (×6): qty 1

## 2013-08-06 NOTE — Progress Notes (Signed)
[facility] notified of hgb of 8.9 as compared to 11.6 from previous day. Will cont to monitor.

## 2013-08-06 NOTE — Progress Notes (Signed)
UR completed 

## 2013-08-06 NOTE — Consult Note (Signed)
Jesse Owen is an 80 y.o. male.   Chief Complaint: Exertional dyspnea HPI: 80 y.o. 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 left nephrectomy on 09/09/13 presented to the ED with complaints of worsening dyspnea. According to patient and spouse, no prior history of dyspnea although patient says that at times he might have had some dyspnea with activity. Patient underwent renal biopsy on 1/14 and since then has been progressively getting weaker and less active. No fever, chills or cough. His hemoglobin is down to 8.9 today.   Past Medical History  Diagnosis Date  . Hypertension   . Hyperlipemia   . TIA (transient ischemic attack)   . Hearing loss     left ear   . Prostate cancer     tx. radiation- greater than 10 yrs  . H/O seasonal allergies     mild hayfever  . Cataracts, bilateral   . UTI (urinary tract infection) 07-13-13    at present to start antibiotic,not yet started or picked up from pharmacy  . Hematuria 07/15/2013      Past Surgical History  Procedure Laterality Date  . Melanoma excision    . Cystoscopy with retrograde pyelogram, ureteroscopy and stent placement Left 07/15/2013    Procedure: CYSTOSCOPY WITH BILATERAL RETROGRADE PYELOGRAM, URETEROSCOPY AND LEFT URETERA LDILATION STENT PLACEMENT AND  RENAL MASS BIOPSY;  Surgeon: Molli Hazard, MD;  Location: [facility];  Service: Urology;  Laterality: Left;    History reviewed. No pertinent family history. Social History:  reports that he has never smoked. He has never used smokeless tobacco. He reports that he does not drink alcohol or use illicit drugs.  Allergies: No Known Allergies  Medications Prior to Admission  Medication Sig Dispense Refill  . amLODipine (NORVASC) 10 MG tablet Take 5 mg by mouth every morning.       . Calcium Carb-Cholecalciferol (CALCIUM 600 + D PO) Take 1 tablet  by mouth 2 (two) times daily.      . clopidogrel (PLAVIX) 75 MG tablet Take 75 mg by mouth daily with breakfast.       . finasteride (PROSCAR) 5 MG tablet Take 5 mg by mouth every morning.       . flunisolide (NASAREL) 29 MCG/ACT (0.025%) nasal spray Place 2 sprays into the nose daily as needed for rhinitis or allergies. Dose is for each nostril.      . furosemide (LASIX) 40 MG tablet Take 40 mg by mouth every morning.       . hydrochlorothiazide (HYDRODIURIL) 25 MG tablet Take 25 mg by mouth every morning.       . hydrocortisone cream 1 % Apply 1 application topically 2 (two) times daily as needed (Applies to back for itching.).      Marland Kitchen loratadine (CLARITIN) 10 MG tablet Take 10 mg by mouth every morning.       Marland Kitchen losartan (COZAAR) 100 MG tablet Take 100 mg by mouth daily with breakfast.       . Misc Natural Products (OSTEO BI-FLEX JOINT SHIELD PO) Take 1 tablet by mouth 2 (two) times daily.       . Multiple Vitamin (MULTIVITAMIN WITH MINERALS) TABS tablet Take 1 tablet by mouth every morning.       Marland Kitchen oxyCODONE-acetaminophen (PERCOCET/ROXICET) 5-325 MG per tablet Take 1-2 tablets by mouth every 4 (four) hours as needed for severe pain.       . phenazopyridine (  PYRIDIUM) 200 MG tablet Take 1 tablet (200 mg total) by mouth 3 (three) times daily as needed for pain.  30 tablet  6  . polyvinyl alcohol (LIQUIFILM TEARS) 1.4 % ophthalmic solution Place 2 drops into both eyes daily as needed for dry eyes.      . potassium gluconate 595 MG TABS tablet Take 595 mg by mouth every morning.       . sennosides-docusate sodium (SENOKOT-S) 8.6-50 MG tablet Take 1 tablet by mouth at bedtime.      . simvastatin (ZOCOR) 40 MG tablet Take 20 mg by mouth at bedtime.      . tamsulosin (FLOMAX) 0.4 MG CAPS capsule Take 1 capsule (0.4 mg total) by mouth at bedtime.  30 capsule  3    Results for orders placed during the hospital encounter of 08/05/13 (from the past 48 hour(s))  CBC WITH DIFFERENTIAL     Status: Abnormal    Collection Time    08/05/13  1:20 PM      Result Value Range   WBC 11.3 (*) 4.0 - 10.5 K/uL   RBC 3.33 (*) 4.22 - 5.81 MIL/uL   Hemoglobin 10.0 (*) 13.0 - 17.0 g/dL   HCT 31.3 (*) 39.0 - 52.0 %   MCV 94.0  78.0 - 100.0 fL   MCH 30.0  26.0 - 34.0 pg   MCHC 31.9  30.0 - 36.0 g/dL   RDW 13.7  11.5 - 15.5 %   Platelets 514 (*) 150 - 400 K/uL   Neutrophils Relative % 61  43 - 77 %   Neutro Abs 6.9  1.7 - 7.7 K/uL   Lymphocytes Relative 26  12 - 46 %   Lymphs Abs 2.9  0.7 - 4.0 K/uL   Monocytes Relative 8  3 - 12 %   Monocytes Absolute 1.0  0.1 - 1.0 K/uL   Eosinophils Relative 4  0 - 5 %   Eosinophils Absolute 0.4  0.0 - 0.7 K/uL   Basophils Relative 1  0 - 1 %   Basophils Absolute 0.1  0.0 - 0.1 K/uL  COMPREHENSIVE METABOLIC PANEL     Status: Abnormal   Collection Time    08/05/13  1:20 PM      Result Value Range   Sodium 137  137 - 147 mEq/L   Potassium 3.4 (*) 3.7 - 5.3 mEq/L   Chloride 93 (*) 96 - 112 mEq/L   CO2 28  19 - 32 mEq/L   Glucose, Bld 137 (*) 70 - 99 mg/dL   BUN 30 (*) 6 - 23 mg/dL   Creatinine, Ser 1.67 (*) 0.50 - 1.35 mg/dL   Calcium 11.1 (*) 8.4 - 10.5 mg/dL   Total Protein 7.1  6.0 - 8.3 g/dL   Albumin 3.3 (*) 3.5 - 5.2 g/dL   AST 23  0 - 37 U/L   ALT 16  0 - 53 U/L   Alkaline Phosphatase 68  39 - 117 U/L   Total Bilirubin 0.6  0.3 - 1.2 mg/dL   GFR calc non Af Amer 37 (*) >90 mL/min   GFR calc Af Amer 43 (*) >90 mL/min   Comment: (NOTE)     The eGFR has been calculated using the CKD EPI equation.     This calculation has not been validated in all clinical situations.     eGFR's persistently <90 mL/min signify possible Chronic Kidney     Disease.  PRO B NATRIURETIC PEPTIDE  Status: None   Collection Time    08/05/13  1:20 PM      Result Value Range   Pro B Natriuretic peptide (BNP) 261.9  0 - 450 pg/mL  D-DIMER, QUANTITATIVE     Status: Abnormal   Collection Time    08/05/13  1:20 PM      Result Value Range   D-Dimer, Quant 0.85 (*) 0.00 -  0.48 ug/mL-FEU   Comment:            AT THE INHOUSE ESTABLISHED CUTOFF     VALUE OF 0.48 ug/mL FEU,     THIS ASSAY HAS BEEN DOCUMENTED     IN THE LITERATURE TO HAVE     A SENSITIVITY AND NEGATIVE     PREDICTIVE VALUE OF AT LEAST     98 TO 99%.  THE TEST RESULT     SHOULD BE CORRELATED WITH     AN ASSESSMENT OF THE CLINICAL     PROBABILITY OF DVT / VTE.  POCT I-STAT TROPONIN I     Status: None   Collection Time    08/05/13  1:25 PM      Result Value Range   Troponin i, poc 0.02  0.00 - 0.08 ng/mL   Comment 3            Comment: Due to the release kinetics of cTnI,     a negative result within the first hours     of the onset of symptoms does not rule out     myocardial infarction with certainty.     If myocardial infarction is still suspected,     repeat the test at appropriate intervals.  URINALYSIS, ROUTINE W REFLEX MICROSCOPIC     Status: Abnormal   Collection Time    08/05/13  1:51 PM      Result Value Range   Color, Urine ORANGE (*) YELLOW   Comment: BIOCHEMICALS MAY BE AFFECTED BY COLOR   APPearance CLEAR  CLEAR   Specific Gravity, Urine 1.013  1.005 - 1.030   pH 7.0  5.0 - 8.0   Glucose, UA NEGATIVE  NEGATIVE mg/dL   Hgb urine dipstick LARGE (*) NEGATIVE   Bilirubin Urine NEGATIVE  NEGATIVE   Ketones, ur NEGATIVE  NEGATIVE mg/dL   Protein, ur >300 (*) NEGATIVE mg/dL   Urobilinogen, UA 1.0  0.0 - 1.0 mg/dL   Nitrite POSITIVE (*) NEGATIVE   Leukocytes, UA SMALL (*) NEGATIVE  URINE MICROSCOPIC-ADD ON     Status: Abnormal   Collection Time    08/05/13  1:51 PM      Result Value Range   WBC, UA 3-6  <3 WBC/hpf   RBC / HPF 21-50  <3 RBC/hpf   Bacteria, UA RARE  RARE   Casts HYALINE CASTS (*) NEGATIVE  POCT I-STAT, CHEM 8     Status: Abnormal   Collection Time    08/05/13  2:25 PM      Result Value Range   Sodium 137  137 - 147 mEq/L   Potassium 3.3 (*) 3.7 - 5.3 mEq/L   Chloride 97  96 - 112 mEq/L   BUN 31 (*) 6 - 23 mg/dL   Creatinine, Ser 1.60 (*) 0.50 -  1.35 mg/dL   Glucose, Bld 132 (*) 70 - 99 mg/dL   Calcium, Ion 1.41 (*) 1.13 - 1.30 mmol/L   TCO2 28  0 - 100 mmol/L   Hemoglobin 11.6 (*) 13.0 - 17.0 g/dL   HCT  34.0 (*) 39.0 - 32.5 %  BASIC METABOLIC PANEL     Status: Abnormal   Collection Time    08/06/13  3:48 AM      Result Value Range   Sodium 135 (*) 137 - 147 mEq/L   Potassium 3.4 (*) 3.7 - 5.3 mEq/L   Chloride 97  96 - 112 mEq/L   CO2 24  19 - 32 mEq/L   Glucose, Bld 219 (*) 70 - 99 mg/dL   BUN 35 (*) 6 - 23 mg/dL   Creatinine, Ser 1.70 (*) 0.50 - 1.35 mg/dL   Calcium 9.2  8.4 - 10.5 mg/dL   GFR calc non Af Amer 36 (*) >90 mL/min   GFR calc Af Amer 42 (*) >90 mL/min   Comment: (NOTE)     The eGFR has been calculated using the CKD EPI equation.     This calculation has not been validated in all clinical situations.     eGFR's persistently <90 mL/min signify possible Chronic Kidney     Disease.  CBC     Status: Abnormal   Collection Time    08/06/13  3:48 AM      Result Value Range   WBC 7.8  4.0 - 10.5 K/uL   RBC 2.95 (*) 4.22 - 5.81 MIL/uL   Hemoglobin 8.9 (*) 13.0 - 17.0 g/dL   Comment: DELTA CHECK NOTED     REPEATED TO VERIFY     PREVIOUS RESULT ISTAT   HCT 27.7 (*) 39.0 - 52.0 %   MCV 93.9  78.0 - 100.0 fL   MCH 30.2  26.0 - 34.0 pg   MCHC 32.1  30.0 - 36.0 g/dL   RDW 13.8  11.5 - 15.5 %   Platelets 434 (*) 150 - 400 K/uL  VITAMIN B12     Status: None   Collection Time    08/06/13  3:48 AM      Result Value Range   Vitamin B-12 680  211 - 911 pg/mL   Comment: Performed at [place]     Status: None   Collection Time    08/06/13  3:48 AM      Result Value Range   Folate >20.0     Comment: (NOTE)     Reference Ranges            Deficient:       0.4 - 3.3 ng/mL            Indeterminate:   3.4 - 5.4 ng/mL            Normal:              > 5.4 ng/mL     Performed at [facility]  [facility]     Status: Abnormal   Collection Time    08/06/13  3:48 AM      Result Value Range    Ferritin 514 (*) 22 - 322 ng/mL   Comment: Performed at [place]     Status: Abnormal   Collection Time    08/06/13  3:48 AM      Result Value Range   Retic Ct Pct 4.0 (*) 0.4 - 3.1 %   RBC. 2.95 (*) 4.22 - 5.81 MIL/uL   Retic Count, Manual 118.0  19.0 - 186.0 K/uL  MAGNESIUM     Status: None   Collection Time    08/06/13  3:48 AM  Result Value Range   Magnesium 1.7  1.5 - 2.5 mg/dL   Dg Chest 2 View  08/05/2013   CLINICAL DATA:  Shortness of breath  EXAM: CHEST  2 VIEW  COMPARISON:  None.  FINDINGS: AP and lateral views of the chest show no focal airspace consolidation. No evidence for pulmonary edema or pneumothorax. No pleural effusion. Basilar atelectasis is evident. The cardiopericardial silhouette is within normal limits for size. Telemetry leads overlie the chest.  IMPRESSION: Bibasilar atelectasis without edema or focal lung consolidation.   Electronically Signed   By: Misty Stanley M.D.   On: 08/05/2013 14:06   Ct Angio Chest Pe W/cm &/or Wo Cm  08/05/2013   CLINICAL DATA:  Shortness of Breath ; history of prostate carcinoma  EXAM: CT ANGIOGRAPHY CHEST WITH CONTRAST  TECHNIQUE: Multidetector CT imaging of the chest was performed using the standard protocol during bolus administration of intravenous contrast. Multiplanar CT image reconstructions including MIPs were obtained to evaluate the vascular anatomy.  CONTRAST:  70 mL OMNIPAQUE IOHEXOL 350 MG/ML SOLN  COMPARISON:  Chest CT December 18, 2008 and chest radiograph August 05, 2013  FINDINGS: There is no demonstrable pulmonary embolus. There is no thoracic aortic aneurysm or dissection.  There is underlying centrilobular emphysema. There are areas of interstitial fibrosis in the periphery of both upper and lower lobes which has become more pronounced compared to the previous study. There does appear to be superimposed interstitial edema in the lung bases. There is a small area of airspace consolidation in the  inferior lingula posteriorly.  A previously noted 2 mm nodular opacity in the right middle lobe remains stable.  There is no appreciable thoracic adenopathy. The pericardium is not thickened.  In the visualized upper abdomen, there is a probable granuloma in the anterior segment right lobe. There is a 1 cm cyst in the medial segment left lobe, stable. An 8 mm cyst in the anterior segment of the right lobe is stable as well. Visualized upper abdominal structures otherwise appear normal.  There are no blastic or lytic bone lesions.  Thyroid appears normal.  Review of the MIP images confirms the above findings.  IMPRESSION: Underlying emphysema with areas of interstitial fibrosis, progressed from 2010. Evidence of bibasilar edema; question a degree of underlying congestive heart failure superimposed on emphysematous change. Small area of infiltrate in the inferior lingula, either representing alveolar edema or possibly small focus of pneumonia.  No demonstrable pulmonary embolus.  Stable 2 mm nodular opacity on the right. Stability since 2010 is consistent with benign etiology.   Electronically Signed   By: Lowella Grip M.D.   On: 08/05/2013 15:02   US Renal  08/06/2013   CLINICAL DATA:  History of removal of renal stent.  EXAM: RENAL/URINARY TRACT ULTRASOUND COMPLETE  COMPARISON:  CT abdomen pelvis June 10, 2013  FINDINGS: Right Kidney:  Length: 11.1 cm. There is mild diffuse increased echotexture of the right kidney. No mass or hydronephrosis visualized.  Left Kidney:  Length: 12.5 cm. There is mild diffuse increased echotexture of the left kidney. No hydronephrosis visualized. There are simple cysts within the left kidney, largest in the upper pole measuring 2.6 x 2.5 x 2.2 cm.  Bladder:  Appears normal for degree of bladder distention.  IMPRESSION: Mild diffuse increased echotexture of the kidneys. This can be seen in medical renal disease. Simple cysts within the left kidney. No hydronephrosis  bilaterally.   Electronically Signed   By: Mallie Darting.D.  On: 08/06/2013 14:46    ROS Constitutional: Negative for fever, chills, night sweats, anorexia, weight loss, pain.  Cardiovascular: no chest pain or dyspnea on exertion until now. Respiratory: negative  Neurological: no TIA or stroke symptoms  Dermatological: negative  ENT: negative  Skin: Negative.  Gastrointestinal: negative  Genito-Urinary: negative  Hematological and Lymphatic: negative  Breast: negative  Musculoskeletal: negative  Remaining ROS negative.  Physical Exam:  Blood pressure 120/65, pulse 108, temperature 98.2 F (36.8 C), temperature source Oral, resp. rate 20, height '5\' 7"'  (1.702 m), weight 52.1 kg (114 lb 13.8 oz), SpO2 96.00%.  General exam: Moderately built and nourished male patient, lying comfortably supine in bed in mild respiratory distress.  Head, eyes and ENT: Nontraumatic and normocephalic. Conj-pale. Pupils equally reacting to light and accommodation. Oral mucosa borderline hydration.  Neck: Supple. No JVD, carotid bruit or thyromegaly.  Lymphatics: No lymphadenopathy.  Respiratory system: Occasional basal crackles but otherwise clear to auscultation. No wheezing or rhonchi. + increased work of breathing.  Cardiovascular system: S1 and S2 heard, RRR. No JVD, gallops, clicks or pedal edema. 3/6 systolic ejection murmur best heard at apex.  Gastrointestinal system: Abdomen is nondistended, soft and nontender. Normal bowel sounds heard. No organomegaly or masses appreciated.  Central nervous system: Alert and oriented. No focal neurological deficits.  Extremities: Symmetric 5 x 5 power. Peripheral pulses symmetrically felt.  Skin: No rashes or acute findings. Pale. Musculoskeletal system: Negative exam.  Psychiatry: Pleasant and cooperative.  Assessment/Plan Acute respiratory failure Acute blood loss anemia Interstitial pulmonary fibrosis  Emphysema lung  Hypokalemia  Anemia   Acute-on-chronic renal failure  Hypertension  Hyperlipemia  Will wait till acute anemia is improved prior to cardiac interventions. May undergo cardiac catheterization if symptoms persist post Hgb over 9-10 and has reasonable life expectancy.   Yasseen Salls S 08/06/2013, 6:33 PM

## 2013-08-06 NOTE — Progress Notes (Signed)
Echocardiogram 2D Echocardiogram has been performed.  Molina Hollenback 08/06/2013, 8:40 AM

## 2013-08-06 NOTE — Progress Notes (Signed)
SLP Cancellation Note  Patient Details Name: Jesse Owen MRN: [id number] DOB: [birth year]   Cancelled treatment:        SLP checked twice for swallow assessment.  Pt. Out of room for procedure.  Will see 2/6   Houston Siren M.Ed [employer] [phone]  08/06/2013

## 2013-08-06 NOTE — Progress Notes (Signed)
Note: This document was prepared with digital dictation and possible smart phrase technology. Any transcriptional errors that result from this process are unintentional.   Jesse Owen:[id number] DOB: May 18, 1930 DOA: 08/05/2013 PCP: Vena Austria, MD  Brief narrative: 28 ?, known  Stage IIB T4 N0 melanoma 07/2005 [Breslow's measurement of 4 mm and Clark's level of 4-to the left shoulder] followed by Dr. Delman Cheadle from Dermatology/dr. Alen Blew from Oncology  Prostate cancer, status post external beam radiation around year 2007, followed by Dr. Serita Butcher.  Recent diagnosed [06/2013?] left renal mass s/p retrograde pyelogram, left ureteric stent placement/dilation= low grade papillary urothelial CA History hypertension, secondary tobacco abuse exposure, hyperlipidemia, hypokalemia, admitted 08/05/13 with sub-acute onset shortness of rest and dyspnea Patient states that he's actually been short of rest on and off for the last year and his dermatologist recommended compression stockings last year. Over the past month he has been progressively more uncomfortable getting ready and going to church because he feels a little winded and subsequently over the past 2 weeks he had progressive dyspnea with shortness of breath and lower extremity swelling.  Consultants:  None currently  Procedures:  Echo 08/06/12  Antibiotics:  None currently   Subjective  Feels well  Getting up to the bedside commode caused him to feel a little winded Tolerating diet No chest pain Has some productive sputum which is clear Stool did not show any blood and was not dark according to wife Nursing reports heart rate went to the 140s with minimal exertion   Objective    Interim History: See above  Telemetry: Sinus tach 120-130   Objective: Filed Vitals:   08/06/13 0714 08/06/13 0715 08/06/13 0716 08/06/13 1019  BP: 145/76 124/79 116/64 126/66  Pulse: 101 114 130   Temp:      TempSrc:      Resp:       Height:      Weight:      SpO2: 96% 97% 94%     Intake/Output Summary (Last 24 hours) at 08/06/13 1106 Last data filed at 08/06/13 0839  Gross per 24 hour  Intake    480 ml  Output    400 ml  Net     80 ml    Exam:  General: EOMI, NCAT, no pallor or rest Cardiovascular: S1-S2 slightly tachycardic Respiratory: No accessory muscle use, clinically clear Abdomen: Soft nontender nondistended no rebound Skin grade 1 lower at edema Neuro grossly intact moving all 4 limbs equally  Data Reviewed: Basic Metabolic Panel:  Recent Labs Lab 08/05/13 1320 08/05/13 1425 08/06/13 0348  NA 137 137 135*  K 3.4* 3.3* 3.4*  CL 93* 97 97  CO2 28  --  24  GLUCOSE 137* 132* 219*  BUN 30* 31* 35*  CREATININE 1.67* 1.60* 1.70*  CALCIUM 11.1*  --  9.2  MG  --   --  1.7   Liver Function Tests:  Recent Labs Lab 08/05/13 1320  AST 23  ALT 16  ALKPHOS 68  BILITOT 0.6  PROT 7.1  ALBUMIN 3.3*   No results found for this basename: LIPASE, AMYLASE,  in the last 168 hours No results found for this basename: AMMONIA,  in the last 168 hours CBC:  Recent Labs Lab 08/05/13 1320 08/05/13 1425 08/06/13 0348  WBC 11.3*  --  7.8  NEUTROABS 6.9  --   --   HGB 10.0* 11.6* 8.9*  HCT 31.3* 34.0* 27.7*  MCV 94.0  --  93.9  PLT  514*  --  434*   Cardiac Enzymes: No results found for this basename: CKTOTAL, CKMB, CKMBINDEX, TROPONINI,  in the last 168 hours BNP: No components found with this basename: POCBNP,  CBG: No results found for this basename: GLUCAP,  in the last 168 hours  No results found for this or any previous visit (from the past 240 hour(s)).   Studies:              All Imaging reviewed and is as per above notation   Scheduled Meds: . amLODipine  5 mg Oral q morning - 10a  . clopidogrel  75 mg Oral Q breakfast  . enoxaparin (LOVENOX) injection  40 mg Subcutaneous Q24H  . finasteride  5 mg Oral q morning - 10a  . fluticasone  2 spray Each Nare Daily  . loratadine   10 mg Oral q morning - 10a  . losartan  100 mg Oral Q breakfast  . multivitamin with minerals  1 tablet Oral q morning - 10a  . potassium gluconate  595 mg Oral q morning - 10a  . senna-docusate  1 tablet Oral QHS  . simvastatin  20 mg Oral QHS  . sodium chloride  3 mL Intravenous Q12H  . tamsulosin  0.4 mg Oral QHS   Continuous Infusions:     Assessment/Plan:  Acute multifactorial hypoxic respiratory failure secondary to interstitial pulmonary fibrosis/emphysema with underlying acute /chronic progressive diastolic dysfunction chronic -Secondary to interstitial pulmonary fibrosis and emphysema seen on CT, of unclear etiology.  -Echo 2/5 shows mild mitral valve stenosis grade 1 diastolic dysfunction  -Speech eval is pending -Discontinue IV fluids as has grade 1 diastolic dysfunction but seems euvolemic -Appreciate pulmonology input  Acute on stage II chronic kidney disease  - Likely secondary to diuretics and ARB.  -Temporarily hold diuretics-restart date 2/6. - Patient received contrast during CTA chest this admission.  Hypokalemia  - replete and follow BMP  Anemia  - hemoglobin has dropped from 13.1 on 1/12-10 g/dL.  -No overt bleeding -Follow CBC in a.m.  -Potentially secondary to multiple cancers, senescence of the bone marrow Hypertension  - Controlled. Continue amlodipine [could also cause lower extremity swelling] -Continue ARB.  -Temporarily hold HCTZ.  Orthostatic hypotension  - Brief IV fluid hydration and monitor -Consider discontinuation of Flomax?  Left renal pelvic tumor (low-grade papillary urothelial carcinoma)  - Awaiting surgery. -Discussed with Dr. Jasmine December who recommends ultrasound kidney as he removed a stent in the office -Requests cardiology consult which has been requested-Dr. Doylene Canard will see History of TIA -Unclear when this event was -Continue Plavix for now Hyperlipidemia -Continue statin  Code Status: Full Family Communication: discussed  with wife in detail 08/06/13 at bedside Disposition Plan: inpatient   Verneita Griffes, MD  [facility] Pager [phone] 08/06/2013, 11:06 AM    LOS: 1 day

## 2013-08-07 ENCOUNTER — Encounter (HOSPITAL_COMMUNITY)
Admission: EM | Disposition: A | Discharge status: Home / Self Care | Payer: Self-pay | Source: Home / Self Care | Attending: Family Medicine

## 2013-08-07 DIAGNOSIS — J9600 Acute respiratory failure, unspecified whether with hypoxia or hypercapnia: Secondary | ICD-10-CM

## 2013-08-07 LAB — BASIC METABOLIC PANEL
BUN: 35 mg/dL — AB (ref 6–23)
CHLORIDE: 102 meq/L (ref 96–112)
CO2: 27 mEq/L (ref 19–32)
Calcium: 8.5 mg/dL (ref 8.4–10.5)
Creatinine, Ser: 1.61 mg/dL — ABNORMAL HIGH (ref 0.50–1.35)
GFR calc Af Amer: 45 mL/min — ABNORMAL LOW (ref >90)
GFR calc non Af Amer: 38 mL/min — ABNORMAL LOW (ref >90)
Glucose, Bld: 114 mg/dL — ABNORMAL HIGH (ref 70–99)
Potassium: 4.1 mEq/L (ref 3.7–5.3)
Sodium: 139 mEq/L (ref 137–147)

## 2013-08-07 LAB — PROTIME-INR
INR: 1.03 (ref 0.00–1.49)
Prothrombin Time: 13.3 seconds (ref 11.6–15.2)

## 2013-08-07 LAB — CBC
HEMATOCRIT: 23.8 % — AB (ref 39.0–52.0)
HEMOGLOBIN: 7.8 g/dL — AB (ref 13.0–17.0)
MCH: 31 pg (ref 26.0–34.0)
MCHC: 32.8 g/dL (ref 30.0–36.0)
MCV: 94.4 fL (ref 78.0–100.0)
Platelets: 407 10*3/uL — ABNORMAL HIGH (ref 150–400)
RBC: 2.52 MIL/uL — ABNORMAL LOW (ref 4.22–5.81)
RDW: 14.5 % (ref 11.5–15.5)
WBC: 15.5 10*3/uL — ABNORMAL HIGH (ref 4.0–10.5)

## 2013-08-07 LAB — IRON AND TIBC
IRON: 51 ug/dL (ref 42–135)
Saturation Ratios: 25 % (ref 20–55)
TIBC: 202 ug/dL — AB (ref 215–435)
UIBC: 151 ug/dL (ref 125–400)

## 2013-08-07 LAB — ANTI-SCLERODERMA ANTIBODY: SCLERODERMA (SCL-70) (ENA) ANTIBODY, IGG: NEGATIVE

## 2013-08-07 LAB — SJOGRENS SYNDROME-A EXTRACTABLE NUCLEAR ANTIBODY: SSA (Ro) (ENA) Antibody, IgG: <1

## 2013-08-07 LAB — ABO/RH: ABO/RH(D): A POS

## 2013-08-07 LAB — PREPARE RBC (CROSSMATCH)

## 2013-08-07 LAB — SJOGRENS SYNDROME-B EXTRACTABLE NUCLEAR ANTIBODY: SSB (La) (ENA) Antibody, IgG: <1

## 2013-08-07 LAB — ANTI-DNA ANTIBODY, DOUBLE-STRANDED: ds DNA Ab: 26 IU/mL — ABNORMAL HIGH

## 2013-08-07 SURGERY — LEFT HEART CATHETERIZATION WITH CORONARY ANGIOGRAM
Anesthesia: LOCAL

## 2013-08-07 MED ORDER — LOSARTAN POTASSIUM 25 MG PO TABS
25.0000 mg | ORAL_TABLET | Freq: Every day | ORAL | Status: DC
  Administered 2013-08-08 – 2013-08-11 (×3): 25 mg via ORAL
  Filled 2013-08-07 (×5): qty 1

## 2013-08-07 MED ORDER — ACETAMINOPHEN 325 MG PO TABS
650.0000 mg | ORAL_TABLET | Freq: Once | ORAL | Status: AC
  Administered 2013-08-07: 650 mg via ORAL
  Filled 2013-08-07: qty 2

## 2013-08-07 MED ORDER — FUROSEMIDE 10 MG/ML IJ SOLN
20.0000 mg | Freq: Once | INTRAMUSCULAR | Status: AC
  Administered 2013-08-07: 20 mg via INTRAVENOUS

## 2013-08-07 MED ORDER — DIPHENHYDRAMINE HCL 25 MG PO CAPS
25.0000 mg | ORAL_CAPSULE | Freq: Once | ORAL | Status: AC
  Administered 2013-08-07: 25 mg via ORAL
  Filled 2013-08-07: qty 1

## 2013-08-07 MED ORDER — HYDROCHLOROTHIAZIDE 25 MG PO TABS
25.0000 mg | ORAL_TABLET | Freq: Every morning | ORAL | Status: DC
  Administered 2013-08-08 – 2013-08-11 (×3): 25 mg via ORAL
  Filled 2013-08-07 (×4): qty 1

## 2013-08-07 MED ORDER — ISOSORBIDE MONONITRATE 15 MG HALF TABLET
15.0000 mg | ORAL_TABLET | Freq: Every day | ORAL | Status: DC
  Administered 2013-08-08 – 2013-08-11 (×3): 15 mg via ORAL
  Filled 2013-08-07 (×4): qty 1

## 2013-08-07 MED ORDER — ENOXAPARIN SODIUM 30 MG/0.3ML ~~LOC~~ SOLN
30.0000 mg | SUBCUTANEOUS | Status: DC
  Administered 2013-08-07: 30 mg via SUBCUTANEOUS
  Filled 2013-08-07 (×2): qty 0.3

## 2013-08-07 MED ORDER — FUROSEMIDE 10 MG/ML IJ SOLN
20.0000 mg | Freq: Once | INTRAMUSCULAR | Status: AC
  Administered 2013-08-07: 20 mg via INTRAVENOUS
  Filled 2013-08-07: qty 2

## 2013-08-07 NOTE — Progress Notes (Signed)
Patient changed to inpatient- requiring PRBC con't need to monitor labs and respiratory status

## 2013-08-07 NOTE — Evaluation (Signed)
Clinical/Bedside Swallow Evaluation Patient Details  Name: DEVONTAE CASASOLA MRN: [id number] Date of Birth: 1929-08-12  Today's Date: 08/07/2013 Time: 6063-0160 SLP Time Calculation (min): 12 min  Past Medical History:  Past Medical History  Diagnosis Date  . Hypertension   . Hyperlipemia   . TIA (transient ischemic attack)   . Hearing loss     left ear   . Prostate cancer     tx. radiation- greater than 10 yrs  . H/O seasonal allergies     mild hayfever  . Cataracts, bilateral   . UTI (urinary tract infection) 07-13-13    at present to start antibiotic,not yet started or picked up from pharmacy  . Hematuria 07/15/2013   Past Surgical History:  Past Surgical History  Procedure Laterality Date  . Melanoma excision    . 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  Patient describes episodic SOB with activity over the past couple of months with decreased activity tolerance. He notes over the past couple of years he has not been as active.  Since 1/30 he has had a significant increase in SOB with activity.  He relays he can only walk short distances (approx 25 ft) before he feels short of breath, lightheaded and "his heart is running away".  He experienced an episode on 2/1 where he had gone to the mailbox and upon coming back to the house, he collapsed on the couch because of dyspnea.  Patient denies chest pain, pain with inspiration, syncope,  n/v/d, headaches, vision changes, abdominal pain.  He endorses dark stool, mild increase in LE swelling for which he wears compression stockings and occ cough with white sputum production.    Assessment / Plan / Recommendation Clinical Impression  Pt demonstrates normal function. No evidence of aspiration or any difficulty. Recommend pt continue regular diet. No SLP f/u warranted.     Aspiration Risk  Mild    Diet Recommendation Regular;Thin liquid   Liquid Administration via: Cup;Straw Medication Administration: Whole meds with liquid Supervision: Patient able to self feed Postural Changes and/or Swallow Maneuvers: Seated upright 90 degrees    Other  Recommendations Oral Care Recommendations: Oral care Q4 per protocol   Follow Up Recommendations  None    Frequency and Duration        Pertinent Vitals/Pain NA    SLP Swallow Goals     Swallow Study Prior Functional Status       General HPI:  80 y/o M, never smoker, with a PMH of HTN, HLD, TIA, melanoma excision, prostate cancer s/p XRT in 2000, and 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  Patient describes episodic SOB with activity over the past couple of months with decreased activity tolerance. He notes over the past couple of years he has not been as active.  Since 1/30 he has had a significant increase in SOB with activity.  He relays he can only walk short distances (approx 25 ft) before he feels short of breath, lightheaded and "his heart is running away".  He experienced an episode on 2/1 where he had gone to the mailbox and upon coming back to the house, he collapsed on the couch because of dyspnea.  Patient denies chest pain, pain with inspiration, syncope, n/v/d, headaches, vision changes, abdominal pain.  He endorses dark stool, mild increase in LE swelling for which he wears compression stockings and occ  cough with white sputum production.  Type of Study: Bedside swallow evaluation Diet Prior to this Study: Regular;Thin liquids Temperature Spikes Noted: No Respiratory Status: Nasal cannula History of Recent Intubation: No Behavior/Cognition: Alert;Cooperative;Pleasant mood Oral Cavity - Dentition: Adequate natural dentition Self-Feeding Abilities: Able to feed self Patient Positioning: Upright in bed Baseline Vocal Quality: Clear Volitional Cough: Strong Volitional Swallow: Able to elicit    Oral/Motor/Sensory Function Overall Oral Motor/Sensory Function: Appears within functional limits for tasks assessed   Ice Chips     Thin Liquid Thin Liquid: Within functional limits    Nectar Thick Nectar Thick Liquid: Not tested   Honey Thick Honey Thick Liquid: Not tested   Puree Puree: Within functional limits   Solid   GO    Solid: Within functional limits      [facility], MA CCC-SLP [phone]  Essa Wenk, Katherene Ponto 08/07/2013,3:35 PM

## 2013-08-07 NOTE — Progress Notes (Signed)
Subjective:  Further drop in hemoglobin this AM. Received 2 units of Packed RBC transfused. Afebrile.  Objective:  Vital Signs in the last 24 hours: Temp:  [97.7 F (36.5 C)-98.9 F (37.2 C)] 98.9 F (37.2 C) (02/06 1810) Pulse Rate:  [80-103] 103 (02/06 1810) Cardiac Rhythm:  [-] Sinus tachycardia (02/06 0845) Resp:  [18-20] 18 (02/06 1810) BP: (109-149)/(59-76) 129/64 mmHg (02/06 1810) SpO2:  [96 %-98 %] 98 % (02/06 1810) Weight:  [52.2 kg (115 lb 1.3 oz)] 52.2 kg (115 lb 1.3 oz) (02/06 0516)  Physical Exam: BP Readings from Last 1 Encounters:  08/07/13 129/64     Wt Readings from Last 1 Encounters:  08/07/13 52.2 kg (115 lb 1.3 oz)    Weight change: -26.499 kg (-58 lb 6.7 oz)  HEENT: /AT, Eyes-Hazel, PERL, EOMI, Conjunctiva-Pale pink, Sclera-Non-icteric Neck: No JVD, No bruit, Trachea midline. Lungs:  Few basal fine crackles, Bilateral. Cardiac:  Regular rhythm, normal S1 and S2, no S3. III/VI systolic murmur  Abdomen:  Soft, non-tender. Extremities:  No edema present. No cyanosis. No clubbing. CNS: AxOx3, Cranial nerves grossly intact, moves all 4 extremities. Right handed. Skin: Warm and dry.   Intake/Output from previous day: 02/05 0701 - 02/06 0700 In: 720 [P.O.:720] Out: 1075 [Urine:1075]    Lab Results: BMET    Component Value Date/Time   NA 139 08/07/2013 0338   NA 138 10/14/2012 0940   K 4.1 08/07/2013 0338   K 3.8 10/14/2012 0940   CL 102 08/07/2013 0338   CL 100 10/14/2012 0940   CO2 27 08/07/2013 0338   CO2 26 10/14/2012 0940   GLUCOSE 114* 08/07/2013 0338   GLUCOSE 129* 10/14/2012 0940   BUN 35* 08/07/2013 0338   BUN 25.7 10/14/2012 0940   CREATININE 1.61* 08/07/2013 0338   CREATININE 1.2 10/14/2012 0940   CALCIUM 8.5 08/07/2013 0338   CALCIUM 9.7 10/14/2012 0940   GFRNONAA 38* 08/07/2013 0338   GFRAA 45* 08/07/2013 0338   CBC    Component Value Date/Time   WBC 15.5* 08/07/2013 0338   WBC 9.6 10/14/2012 0940   RBC 2.52* 08/07/2013 0338   RBC 2.95* 08/06/2013 0348   RBC 4.39 10/14/2012 0940   HGB 7.8* 08/07/2013 0338   HGB 13.5 10/14/2012 0940   HCT 23.8* 08/07/2013 0338   HCT 39.7 10/14/2012 0940   PLT 407* 08/07/2013 0338   PLT 326 10/14/2012 0940   MCV 94.4 08/07/2013 0338   MCV 90.4 10/14/2012 0940   MCH 31.0 08/07/2013 0338   MCH 30.7 10/14/2012 0940   MCHC 32.8 08/07/2013 0338   MCHC 33.9 10/14/2012 0940   RDW 14.5 08/07/2013 0338   RDW 13.0 10/14/2012 0940   LYMPHSABS 2.9 08/05/2013 1320   LYMPHSABS 2.8 10/14/2012 0940   MONOABS 1.0 08/05/2013 1320   MONOABS 0.9 10/14/2012 0940   EOSABS 0.4 08/05/2013 1320   EOSABS 0.6* 10/14/2012 0940   BASOSABS 0.1 08/05/2013 1320   BASOSABS 0.1 10/14/2012 0940   CARDIAC ENZYMES No results found for this basename: CKTOTAL, CKMB, CKMBINDEX, TROPONINI    Scheduled Meds: . amLODipine  5 mg Oral q morning - 10a  . clopidogrel  75 mg Oral Q breakfast  . enoxaparin (LOVENOX) injection  30 mg Subcutaneous Q24H  . finasteride  5 mg Oral q morning - 10a  . fluticasone  2 spray Each Nare Daily  . [START ON 08/08/2013] hydrochlorothiazide  25 mg Oral q morning - 10a  . [START ON 08/08/2013] isosorbide mononitrate  15 mg  Oral Daily  . loratadine  10 mg Oral q morning - 10a  . [START ON 08/08/2013] losartan  25 mg Oral Q breakfast  . multivitamin with minerals  1 tablet Oral q morning - 10a  . potassium gluconate  595 mg Oral q morning - 10a  . senna-docusate  1 tablet Oral QHS  . simvastatin  20 mg Oral QHS  . sodium chloride  3 mL Intravenous Q12H  . tamsulosin  0.4 mg Oral QHS   Continuous Infusions:  PRN Meds:.acetaminophen, acetaminophen, albuterol, ondansetron (ZOFRAN) IV, ondansetron, oxyCODONE-acetaminophen, phenazopyridine, polyvinyl alcohol  Assessment/Plan: Acute respiratory failure  Acute blood loss anemia  Interstitial pulmonary fibrosis  Emphysema lung  Hypokalemia  Anemia  Acute-on-chronic renal failure  Hypertension  Hyperlipemia  Continue medical treatment.   LOS: 2 days    Dixie Dials  MD  08/07/2013, 7:26  PM

## 2013-08-07 NOTE — Progress Notes (Signed)
Note: This document was prepared with digital dictation and possible smart phrase technology. Any transcriptional errors that result from this process are unintentional.   Jesse Owen BZJ:[id number] DOB: 1929/11/04 DOA: 08/05/2013 PCP: Vena Austria, MD  Brief narrative: 41 ?, known  Stage IIB T4 N0 melanoma 07/2005 [Breslow's measurement of 4 mm and Clark's level of 4-to the left shoulder] followed by Dr. Delman Cheadle from Dermatology/dr. Alen Blew from Oncology  Prostate cancer, status post external beam radiation around year 2007, followed by Dr. Serita Butcher.  Recent diagnosed [06/2013?] left renal mass s/p retrograde pyelogram, left ureteric stent placement/dilation= low grade papillary urothelial CA History hypertension, secondary tobacco abuse exposure, hyperlipidemia, hypokalemia, admitted 08/05/13 with sub-acute onset shortness of rest and dyspnea Patient states that he's actually been short of rest on and off for the last year and his dermatologist recommended compression stockings last year. Over the past month and probably coinciding with his recent renal biopsy and stent placements he has been progressively more uncomfortable getting ready and going to church because he feels a little winded and subsequently over the past 2 weeks he had progressive dyspnea with shortness of breath and lower extremity swelling.  Consultants:  Urology  Cardiology  Pulmonary  Procedures:  Echo 08/06/12  Antibiotics:  None currently   Subjective  Feels a little better than yesterday Has not been out of bed today really Tolerating diet very well Eating and drinking Past urine    Objective    Interim History: See above  Telemetry: Sinus tach resolved sinus rhythm heart rate as high as 90 only   Objective: Filed Vitals:   08/07/13 1115 08/07/13 1145 08/07/13 1245 08/07/13 1345  BP: 149/75 147/73 109/65 138/72  Pulse: 92 98 94 96  Temp: 97.7 F (36.5 C) 98.1 F (36.7 C) 98 F  (36.7 C) 97.8 F (36.6 C)  TempSrc: Oral Oral Oral Oral  Resp: 18 18 20 18   Height:      Weight:      SpO2: 98% 98% 97% 98%    Intake/Output Summary (Last 24 hours) at 08/07/13 1347 Last data filed at 08/07/13 1345  Gross per 24 hour  Intake  982.5 ml  Output   1125 ml  Net -142.5 ml    Exam:  General: EOMI, NCAT, no pallor or rest Cardiovascular: S1-S2 slightly tachycardic Respiratory: No accessory muscle use, clinically clear Abdomen: Soft nontender nondistended no rebound Skin grade 1 lower at edema Neuro grossly intact moving all 4 limbs equally  Data Reviewed: Basic Metabolic Panel:  Recent Labs Lab 08/05/13 1320 08/05/13 1425 08/06/13 0348 08/07/13 0338  NA 137 137 135* 139  K 3.4* 3.3* 3.4* 4.1  CL 93* 97 97 102  CO2 28  --  24 27  GLUCOSE 137* 132* 219* 114*  BUN 30* 31* 35* 35*  CREATININE 1.67* 1.60* 1.70* 1.61*  CALCIUM 11.1*  --  9.2 8.5  MG  --   --  1.7  --    Liver Function Tests:  Recent Labs Lab 08/05/13 1320  AST 23  ALT 16  ALKPHOS 68  BILITOT 0.6  PROT 7.1  ALBUMIN 3.3*   No results found for this basename: LIPASE, AMYLASE,  in the last 168 hours No results found for this basename: AMMONIA,  in the last 168 hours CBC:  Recent Labs Lab 08/05/13 1320 08/05/13 1425 08/06/13 0348 08/07/13 0338  WBC 11.3*  --  7.8 15.5*  NEUTROABS 6.9  --   --   --  HGB 10.0* 11.6* 8.9* 7.8*  HCT 31.3* 34.0* 27.7* 23.8*  MCV 94.0  --  93.9 94.4  PLT 514*  --  434* 407*   Cardiac Enzymes: No results found for this basename: CKTOTAL, CKMB, CKMBINDEX, TROPONINI,  in the last 168 hours BNP: No components found with this basename: POCBNP,  CBG: No results found for this basename: GLUCAP,  in the last 168 hours  No results found for this or any previous visit (from the past 240 hour(s)).   Studies:              All Imaging reviewed and is as per above notation   Scheduled Meds: . amLODipine  5 mg Oral q morning - 10a  . clopidogrel  75  mg Oral Q breakfast  . enoxaparin (LOVENOX) injection  40 mg Subcutaneous Q24H  . finasteride  5 mg Oral q morning - 10a  . fluticasone  2 spray Each Nare Daily  . loratadine  10 mg Oral q morning - 10a  . losartan  100 mg Oral Q breakfast  . multivitamin with minerals  1 tablet Oral q morning - 10a  . potassium gluconate  595 mg Oral q morning - 10a  . senna-docusate  1 tablet Oral QHS  . simvastatin  20 mg Oral QHS  . sodium chloride  3 mL Intravenous Q12H  . tamsulosin  0.4 mg Oral QHS   Continuous Infusions:     Assessment/Plan:  Acute multifactorial hypoxic respiratory failure secondary to interstitial pulmonary fibrosis/emphysema with underlying acute /chronic progressive diastolic dysfunction chronic -Secondary to interstitial pulmonary fibrosis and emphysema seen on CT, of unclear etiology.  -Anti-double-stranded DNA is abnormal at 26-unclear how to interpret this given his advanced age, sjogeren, anti-scleroderma,rheumatoid negative -Echo 2/5 shows mild mitral valve stenosis grade 1 diastolic dysfunction-patient not clinically overtly volume overloaded -Speech eval is pending as of 2/6 -Discontinue IV fluids as has grade 1 diastolic dysfunction but seems euvolemic -Lasix lower dose 20 mg, reevaluate need for diuretics in a.m. -Appreciate pulmonology input, appreciate cardiology input -Will eventually need cardiac catheterization to determine right and left heart pressures this Acute on stage II chronic kidney disease  - Likely secondary to diuretics and ARB.  -Temporarily hold diuretics - Patient received contrast during CTA chest this admission.  Hypokalemia  - replete and follow BMP  Anemia  - hemoglobin has dropped  - Transfused 2 units 08/07/48 -No overt bleeding -Follow CBC in a.m.  -Potentially secondary to multiple cancers, senescence of the bone marrow -Iron studies= ferritin = 514, iron = 51, TIBC = 202, reticulocyte count 4.0, B12 = 60 Hypertension  -  Controlled. Continue amlodipine [could also cause lower extremity swelling] -Will decrease dose of ARB and add nitrate -Temporarily hold HCTZ.  Orthostatic hypotension  -Received some IV fluids on admission but these have been discontinued -Consider discontinuation of Flomax?  Left renal pelvic tumor (low-grade papillary urothelial carcinoma)  - Awaiting surgery as an eventuality as currently he would be too high risk I have discussed this in person with Dr. Jasmine December 08/07/13 -Renal ultrasound only shows medical disease as of 08/06/13 History of TIA -Unclear when this event was -Continue Plavix 75 mg for now Hyperlipidemia -Continue statin  Code Status: Full Family Communication: discussed with wife in detail 08/07/13 at bedside Disposition Plan: inpatient   Verneita Griffes, MD  [facility] Pager [phone] 08/07/2013, 1:47 PM    LOS: 2 days

## 2013-08-07 NOTE — Consult Note (Signed)
Urology Consult  Requesting provider:  Dr. Nita Sells  CC: Acute renal failure. Acute respiratory failure.  HPI: 80 year old male presents with acute respiratory failure.  He has a history of a low-grade urothelial carcinoma of his left renal pelvis.  This was initially discovered in 2010.  We had planned for a left nephroureterectomy to be performed robotically on September 09, 2013.  The patient began having some leg swelling approximately one week following a ureteroscopic biopsy of the renal pelvic mass.  This progressed to severe dyspnea on exertion.  He presented to the ER where he was found to be in acute renal failure.  Renal ultrasound yesterday revealed no hydronephrosis.  His renal failure is most likely due to his prerenal status.  It appears he may have some emphysema of the lungs but the main problem is most likely from the heart.  He is currently being evaluated by a cardiologist as well as the pulmonary physicians.  Today I discussed with the patient and his wife the risks and benefits of surgery again.  Given his acute decompensation recently I do not think it would be a good idea to proceed with surgery unless he is able to make a dramatic improvement.  We discussed the risks and benefits of proceeding with each course.  The risk of proceeding with a major surgery such as this in light of his recent decompensation would be very large.  Non-treatment of his urothelial cancer could result in progression of disease, but at this point it has been present for at least 4 years and likely longer.  PMH: Past Medical History  Diagnosis Date  . Hypertension   . Hyperlipemia   . TIA (transient ischemic attack)   . Hearing loss     left ear   . Prostate cancer     tx. radiation- greater than 10 yrs  . H/O seasonal allergies     mild hayfever  . Cataracts, bilateral   . UTI (urinary tract infection) 07-13-13    at present to start antibiotic,not yet started or picked up from pharmacy   . Hematuria 07/15/2013    PSH: Past Surgical History  Procedure Laterality Date  . Melanoma excision    . Cystoscopy with retrograde pyelogram, ureteroscopy and stent placement Left 07/15/2013    Procedure: CYSTOSCOPY WITH BILATERAL RETROGRADE PYELOGRAM, URETEROSCOPY AND LEFT URETERA LDILATION STENT PLACEMENT AND  RENAL MASS BIOPSY;  Surgeon: Molli Hazard, MD;  Location: [facility];  Service: Urology;  Laterality: Left;    Allergies: No Known Allergies  Medications: Prescriptions prior to admission  Medication Sig Dispense Refill  . amLODipine (NORVASC) 10 MG tablet Take 5 mg by mouth every morning.       . Calcium Carb-Cholecalciferol (CALCIUM 600 + D PO) Take 1 tablet by mouth 2 (two) times daily.      . clopidogrel (PLAVIX) 75 MG tablet Take 75 mg by mouth daily with breakfast.       . finasteride (PROSCAR) 5 MG tablet Take 5 mg by mouth every morning.       . flunisolide (NASAREL) 29 MCG/ACT (0.025%) nasal spray Place 2 sprays into the nose daily as needed for rhinitis or allergies. Dose is for each nostril.      . furosemide (LASIX) 40 MG tablet Take 40 mg by mouth every morning.       . hydrochlorothiazide (HYDRODIURIL) 25 MG tablet Take 25 mg by mouth every morning.       . hydrocortisone  cream 1 % Apply 1 application topically 2 (two) times daily as needed (Applies to back for itching.).      Marland Kitchen loratadine (CLARITIN) 10 MG tablet Take 10 mg by mouth every morning.       Marland Kitchen losartan (COZAAR) 100 MG tablet Take 100 mg by mouth daily with breakfast.       . Misc Natural Products (OSTEO BI-FLEX JOINT SHIELD PO) Take 1 tablet by mouth 2 (two) times daily.       . Multiple Vitamin (MULTIVITAMIN WITH MINERALS) TABS tablet Take 1 tablet by mouth every morning.       Marland Kitchen oxyCODONE-acetaminophen (PERCOCET/ROXICET) 5-325 MG per tablet Take 1-2 tablets by mouth every 4 (four) hours as needed for severe pain.       . phenazopyridine (PYRIDIUM) 200 MG tablet Take 1 tablet (200 mg total) by  mouth 3 (three) times daily as needed for pain.  30 tablet  6  . polyvinyl alcohol (LIQUIFILM TEARS) 1.4 % ophthalmic solution Place 2 drops into both eyes daily as needed for dry eyes.      . potassium gluconate 595 MG TABS tablet Take 595 mg by mouth every morning.       . sennosides-docusate sodium (SENOKOT-S) 8.6-50 MG tablet Take 1 tablet by mouth at bedtime.      . simvastatin (ZOCOR) 40 MG tablet Take 20 mg by mouth at bedtime.      . tamsulosin (FLOMAX) 0.4 MG CAPS capsule Take 1 capsule (0.4 mg total) by mouth at bedtime.  30 capsule  3     Social History: History   Social History  . Marital Status: Married    Spouse Name: N/A    Number of Children: N/A  . Years of Education: N/A   Occupational History  . Not on file.   Social History Main Topics  . Smoking status: Never Smoker   . Smokeless tobacco: Never Used  . Alcohol Use: No  . Drug Use: No  . Sexual Activity: Not Currently   Other Topics Concern  . Not on file   Social History Narrative  . No narrative on file    Family History: History reviewed. No pertinent family history.  Review of Systems: Positive: SOB, DOE, edema. Negative: Mental status changes, chest pain, or fever.  A further 10 point review of systems was negative except what is listed in the HPI.  Physical Exam: Filed Vitals:   08/07/13 0518  BP: 116/61  Pulse: 102  Temp:   Resp:     General: No acute distress.  Awake. Head:  Normocephalic.  Atraumatic. ENT:  EOMI.  Mucous membranes moist Neck:  Supple.  No lymphadenopathy. CV:  S1 present. S2 present. Mildly tachycardic. Pulmonary: Equal effort bilaterally.   Abdomen: Soft.  Non- tender to palpation. Skin:  Normal turgor.  No visible rash. Extremity: No gross deformity of bilateral upper extremities.  No gross deformity of    bilateral lower extremities. Neurologic: Alert. Appropriate mood.    Studies:  Recent Labs     08/06/13  0348  08/07/13  0338  HGB  8.9*  7.8*   WBC  7.8  15.5*  PLT  434*  407*    Recent Labs     08/06/13  0348  08/07/13  0338  NA  135*  139  K  3.4*  4.1  CL  97  102  CO2  24  27  BUN  35*  35*  CREATININE  1.70*  1.61*  CALCIUM  9.2  8.5  GFRNONAA  36*  38*  GFRAA  42*  45*     Recent Labs     08/07/13  0338  INR  1.03     No components found with this basename: ABG,     Assessment:  ARF. Acute respiratory failure.  Plan: -After discussion with the patient and his wife will have a quick trigger to delay his surgery until he has a chance to completely recover.  This surgery could greatly exacerbate some of the problems which are already out of control.  -I appreciate the input of pulmonary medicine and cardiology workup of this patient and also appreciate [facility] for managing this patient for his acute illness.    Pager: [phone]    CC: Dr. Nita Sells

## 2013-08-07 NOTE — Progress Notes (Signed)
Name: Jesse Owen MRN: [id number] DOB: [birth year]    ADMISSION DATE:  08/05/2013 CONSULTATION DATE:  08/05/13  REFERRING MD :  Dr. Algis Liming PRIMARY SERVICE:  TRH  CHIEF COMPLAINT:  Abnormal CT  BRIEF PATIENT DESCRIPTION: 80 y/o M, never smoker, admitted on 2/4 with progressive shortness of breath.    SIGNIFICANT EVENTS / STUDIES:  2/4 - Admit with progressive SOB 2/5 2 d echo Left ventricle: The cavity size was normal. Systolic function was vigorous. The estimated ejection fraction was in the range of 65% to 70%. Wall motion was normal; there were no regional wall motion abnormalities. There was a mild outflow tract gradient of 59mmHg, peak velocity 3.37m/s due to hyperdynamic contractile performance with associated tachycardia (HR 107 bpm during exam). Doppler parameters are consistent with abnormal left ventricular relaxation (grade 1 diastolic dysfunction). - Mitral valve: Calcified annulus. Mildly thickened, mildly calcified leaflets anterior. There was systolic anterior motion of calcified anterior leaflet tip and chordal structures. Mild regurgitation. - Pulmonary arteries: Systolic pressure was mildly increased. PA peak pressure: 74mm Hg (S). Transthoracic echocardiography. M-mode, complete 2D, spectral Doppler, and color Doppler. Height: Height: 170.2cm. Height: 67in. Weight: Weight: 51.8kg. Weight: 114lb. Body mass index: BMI: 17.9kg/m^2. Body surface area: BSA: 1.71m^2. Blood pressure: 116/64. Patient status: Inpatient. Location: Bedside.    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  Patient describes episodic SOB with activity over the past couple of months with decreased  activity tolerance. He notes over the past couple of years he has not been as active.  Since 1/30 he has had a significant increase in SOB with activity.  He relays he can only walk short distances (approx 25 ft) before he feels short of breath, lightheaded and "his heart is running away".  He experienced an episode on 2/1 where he had gone to the mailbox and upon coming back to the house, he collapsed on the couch because of dyspnea.  Patient denies chest pain, pain with inspiration, syncope, n/v/d, headaches, vision changes, abdominal pain.  He endorses dark stool, mild increase in LE swelling for which he wears compression stockings and occ cough with white sputum production.   ER work up noted K+ 3.3, Sr Cr 1.60, Hgb 11.6 (was 13.5 in 10/14/12), UA positive for UTI (+nitrate, small leukocytes, 3-6 wbc, rare bacteria), CXR with bibasilar atx without focal infiltrate, and CTA scan of chest neg for PE, notable for emphysema with small areas of interstitial fibrosis, small / stable pulmonary nodule and question of edema. /Allergies: Negative for environmental allergies and polydipsia. Does not bruise/bleed easily.  SUBJECTIVE:   VITAL SIGNS: Temp:  [97.7 F (36.5 C)-98.4 F (36.9 C)] 97.7 F (36.5 C) (02/06 1115) Pulse Rate:  [80-108] 92 (02/06 1115) Resp:  [18-20] 18 (02/06 1115) BP: (116-149)/(59-75) 149/75 mmHg (02/06 1115) SpO2:  [96 %-98 %] 98 % (02/06 1115) Weight:  [115 lb 1.3 oz (52.2 kg)] 115 lb 1.3 oz (52.2 kg) (02/06 0516)  PHYSICAL EXAMINATION: General:  wdwn elderly male in NAD Neuro:  AAOx4, speech clear, MAE HEENT:  Mm pale, moist.  No jvd Cardiovascular:  s1s2 with 3/6 murmur heard best at 2nd ICS RSB Lungs:  resp's even/non-labored, lungs bilaterally essentially clear, few  faint basilar crackles Abdomen:  Obese/soft, bsx4 active Musculoskeletal:  No acute deformities Skin:  PALE, warm/dry.    Recent Labs Lab 08/05/13 1320 08/05/13 1425 08/06/13 0348 08/07/13 0338   NA 137 137 135* 139  K 3.4* 3.3* 3.4* 4.1  CL 93* 97 97 102  CO2 28  --  24 27  BUN 30* 31* 35* 35*  CREATININE 1.67* 1.60* 1.70* 1.61*  GLUCOSE 137* 132* 219* 114*    Recent Labs Lab 08/05/13 1320 08/05/13 1425 08/06/13 0348 08/07/13 0338  HGB 10.0* 11.6* 8.9* 7.8*  HCT 31.3* 34.0* 27.7* 23.8*  WBC 11.3*  --  7.8 15.5*  PLT 514*  --  434* 407*   Dg Chest 2 View  08/05/2013   CLINICAL DATA:  Shortness of breath  EXAM: CHEST  2 VIEW  COMPARISON:  None.  FINDINGS: AP and lateral views of the chest show no focal airspace consolidation. No evidence for pulmonary edema or pneumothorax. No pleural effusion. Basilar atelectasis is evident. The cardiopericardial silhouette is within normal limits for size. Telemetry leads overlie the chest.  IMPRESSION: Bibasilar atelectasis without edema or focal lung consolidation.   Electronically Signed   By: Misty Stanley M.D.   On: 08/05/2013 14:06   Ct Angio Chest Pe W/cm &/or Wo Cm  08/05/2013   CLINICAL DATA:  Shortness of Breath ; history of prostate carcinoma  EXAM: CT ANGIOGRAPHY CHEST WITH CONTRAST  TECHNIQUE: Multidetector CT imaging of the chest was performed using the standard protocol during bolus administration of intravenous contrast. Multiplanar CT image reconstructions including MIPs were obtained to evaluate the vascular anatomy.  CONTRAST:  70 mL OMNIPAQUE IOHEXOL 350 MG/ML SOLN  COMPARISON:  Chest CT December 18, 2008 and chest radiograph August 05, 2013  FINDINGS: There is no demonstrable pulmonary embolus. There is no thoracic aortic aneurysm or dissection.  There is underlying centrilobular emphysema. There are areas of interstitial fibrosis in the periphery of both upper and lower lobes which has become more pronounced compared to the previous study. There does appear to be superimposed interstitial edema in the lung bases. There is a small area of airspace consolidation in the inferior lingula posteriorly.  A previously noted 2 mm nodular  opacity in the right middle lobe remains stable.  There is no appreciable thoracic adenopathy. The pericardium is not thickened.  In the visualized upper abdomen, there is a probable granuloma in the anterior segment right lobe. There is a 1 cm cyst in the medial segment left lobe, stable. An 8 mm cyst in the anterior segment of the right lobe is stable as well. Visualized upper abdominal structures otherwise appear normal.  There are no blastic or lytic bone lesions.  Thyroid appears normal.  Review of the MIP images confirms the above findings.  IMPRESSION: Underlying emphysema with areas of interstitial fibrosis, progressed from 2010. Evidence of bibasilar edema; question a degree of underlying congestive heart failure superimposed on emphysematous change. Small area of infiltrate in the inferior lingula, either representing alveolar edema or possibly small focus of pneumonia.  No demonstrable pulmonary embolus.  Stable 2 mm nodular opacity on the right. Stability since 2010 is consistent with benign etiology.   Electronically Signed   By: Lowella Grip M.D.   On: 08/05/2013 15:02   US Renal  08/06/2013   CLINICAL DATA:  History of removal of renal stent.  EXAM: RENAL/URINARY TRACT ULTRASOUND COMPLETE  COMPARISON:  CT abdomen pelvis June 10, 2013  FINDINGS: Right Kidney:  Length: 11.1  cm. There is mild diffuse increased echotexture of the right kidney. No mass or hydronephrosis visualized.  Left Kidney:  Length: 12.5 cm. There is mild diffuse increased echotexture of the left kidney. No hydronephrosis visualized. There are simple cysts within the left kidney, largest in the upper pole measuring 2.6 x 2.5 x 2.2 cm.  Bladder:  Appears normal for degree of bladder distention.  IMPRESSION: Mild diffuse increased echotexture of the kidneys. This can be seen in medical renal disease. Simple cysts within the left kidney. No hydronephrosis bilaterally.   Electronically Signed   By: Abelardo Diesel M.D.   On:  08/06/2013 14:46    ASSESSMENT / PLAN:  Dyspnea  Hypoxemia Anemia L Renal Pelvic Tumor - awaiting nephrectomy HTN Murmur R/O CHF   80 y/o, never smoker, with emphysema and mild areas of fibrosis on CTA chest. Definite progression compared with prior CT scan 12/08/08.  Dyspnea on exertion with acute worsening prior to admit.  He has had a noted drop in hgb over the past few years from 15 in 2012 to 56 in 2015 + endorses dark stool.  On exam he has noted murmur (new) and endorses subjective palpitations/heart racing & LE edema as well as noted HR up to 140's during admit.  DDX includes all of the above, but timing he describes is suspicious for acute anemia following his renal bx.   Plan: -appreciate cardiology eval of TTE and MV disease -RF is negative, other autoimmune labs are ordered and pending -will need PFT and Pulm f/u as an outpt >> wants to see Dr Melvyn Novas, appointment arranged for 08/18/13 -assess ambulatory desaturation prior to discharge -send iron studies -no role for steroids -hold abx for now -oxygen as needed to keep sats > 92%   Please call if we can help over the weekend.   Richardson Landry Minor ACNP [facility] Pager [phone] till 3 pm If no answer page [phone] 08/07/2013, 11:41 AM  Baltazar Apo, MD, PhD 08/07/2013, 12:03 PM [facility] [phone] or if no answer [phone]

## 2013-08-08 LAB — OCCULT BLOOD X 1 CARD TO LAB, STOOL: Fecal Occult Bld: NEGATIVE

## 2013-08-08 LAB — CBC
HEMATOCRIT: 36.9 % — AB (ref 39.0–52.0)
HEMOGLOBIN: 12.1 g/dL — AB (ref 13.0–17.0)
MCH: 30.5 pg (ref 26.0–34.0)
MCHC: 32.8 g/dL (ref 30.0–36.0)
MCV: 92.9 fL (ref 78.0–100.0)
Platelets: 378 10*3/uL (ref 150–400)
RBC: 3.97 MIL/uL — ABNORMAL LOW (ref 4.22–5.81)
RDW: 15.7 % — ABNORMAL HIGH (ref 11.5–15.5)
WBC: 12.5 10*3/uL — ABNORMAL HIGH (ref 4.0–10.5)

## 2013-08-08 LAB — TYPE AND SCREEN
ABO/RH(D): A POS
Antibody Screen: NEGATIVE
UNIT DIVISION: 0
Unit division: 0

## 2013-08-08 LAB — BASIC METABOLIC PANEL
BUN: 27 mg/dL — ABNORMAL HIGH (ref 6–23)
CALCIUM: 8.4 mg/dL (ref 8.4–10.5)
CO2: 24 mEq/L (ref 19–32)
CREATININE: 1.34 mg/dL (ref 0.50–1.35)
Chloride: 102 mEq/L (ref 96–112)
GFR calc Af Amer: 56 mL/min — ABNORMAL LOW (ref >90)
GFR, EST NON AFRICAN AMERICAN: 48 mL/min — AB (ref >90)
Glucose, Bld: 123 mg/dL — ABNORMAL HIGH (ref 70–99)
Potassium: 3.7 mEq/L (ref 3.7–5.3)
Sodium: 139 mEq/L (ref 137–147)

## 2013-08-08 MED ORDER — METOPROLOL SUCCINATE ER 25 MG PO TB24
25.0000 mg | ORAL_TABLET | Freq: Every day | ORAL | Status: DC
  Administered 2013-08-08 – 2013-08-11 (×3): 25 mg via ORAL
  Filled 2013-08-08 (×4): qty 1

## 2013-08-08 MED ORDER — ENOXAPARIN SODIUM 40 MG/0.4ML ~~LOC~~ SOLN
40.0000 mg | SUBCUTANEOUS | Status: DC
  Administered 2013-08-08 – 2013-08-10 (×3): 40 mg via SUBCUTANEOUS
  Filled 2013-08-08 (×5): qty 0.4

## 2013-08-08 NOTE — Progress Notes (Addendum)
Note: This document was prepared with digital dictation and possible smart phrase technology. Any transcriptional errors that result from this process are unintentional.   Jesse Owen OZD:[id number] DOB: 1930/01/07 DOA: 08/05/2013 PCP: Vena Austria, MD  Brief narrative: 65 ?, known  Stage IIB T4 N0 melanoma 07/2005 [Breslow's measurement of 4 mm and Clark's level of 4-to the left shoulder] followed by Dr. Delman Cheadle from Dermatology/dr. Alen Blew from Oncology  Prostate cancer, status post external beam radiation around year 2007, followed by Dr. Serita Butcher.  Recent diagnosed [06/2013?] left renal mass s/p retrograde pyelogram, left ureteric stent placement/dilation= low grade papillary urothelial CA History hypertension, secondary tobacco abuse exposure, hyperlipidemia, hypokalemia, admitted 08/05/13 with sub-acute onset shortness of rest and dyspnea Patient states that he's actually been short of rest on and off for the last year and his dermatologist recommended compression stockings last year. Over the past month and probably coinciding with his recent renal biopsy and stent placements he has been progressively more uncomfortable getting ready and going to church because he feels a little winded and subsequently over the past 2 weeks he had progressive dyspnea with shortness of breath and lower extremity swelling.  Consultants:  Urology  Cardiology  Pulmonary  Procedures:  Echo 08/06/12  Antibiotics:  None currently   Subjective  Feels much much better after 2 U PRBC 2/6 Tachycardic Tol diet 4 loose stools-not on Purgatives since night before Hemoccult neg    Objective    Interim History: See above  Telemetry: Sinus tach 110-130  Objective: Filed Vitals:   08/08/13 0550 08/08/13 0551 08/08/13 0943 08/08/13 1338  BP: 139/74 130/78 130/72 123/66  Pulse: 86 105 74 101  Temp:   98 F (36.7 C) 97.4 F (36.3 C)  TempSrc:    Oral  Resp:   20 18  Height:        Weight:      SpO2:   98% 99%    Intake/Output Summary (Last 24 hours) at 08/08/13 1342 Last data filed at 08/08/13 1337  Gross per 24 hour  Intake 1232.5 ml  Output   1625 ml  Net -392.5 ml    Exam:  General: EOMI, NCAT, no pallor or rest Cardiovascular: S1-S2 slightly tachycardic Respiratory: No accessory muscle use, clinically clear Abdomen: Soft nontender nondistended no rebound Skin grade 1 lower at edema Neuro grossly intact moving all 4 limbs equally  Data Reviewed: Basic Metabolic Panel:  Recent Labs Lab 08/05/13 1320 08/05/13 1425 08/06/13 0348 08/07/13 0338 08/08/13 0815  NA 137 137 135* 139 139  K 3.4* 3.3* 3.4* 4.1 3.7  CL 93* 97 97 102 102  CO2 28  --  24 27 24   GLUCOSE 137* 132* 219* 114* 123*  BUN 30* 31* 35* 35* 27*  CREATININE 1.67* 1.60* 1.70* 1.61* 1.34  CALCIUM 11.1*  --  9.2 8.5 8.4  MG  --   --  1.7  --   --    Liver Function Tests:  Recent Labs Lab 08/05/13 1320  AST 23  ALT 16  ALKPHOS 68  BILITOT 0.6  PROT 7.1  ALBUMIN 3.3*   No results found for this basename: LIPASE, AMYLASE,  in the last 168 hours No results found for this basename: AMMONIA,  in the last 168 hours CBC:  Recent Labs Lab 08/05/13 1320 08/05/13 1425 08/06/13 0348 08/07/13 0338 08/08/13 0815  WBC 11.3*  --  7.8 15.5* 12.5*  NEUTROABS 6.9  --   --   --   --  HGB 10.0* 11.6* 8.9* 7.8* 12.1*  HCT 31.3* 34.0* 27.7* 23.8* 36.9*  MCV 94.0  --  93.9 94.4 92.9  PLT 514*  --  434* 407* 378   Cardiac Enzymes: No results found for this basename: CKTOTAL, CKMB, CKMBINDEX, TROPONINI,  in the last 168 hours BNP: No components found with this basename: POCBNP,  CBG: No results found for this basename: GLUCAP,  in the last 168 hours  No results found for this or any previous visit (from the past 240 hour(s)).   Studies:              All Imaging reviewed and is as per above notation   Scheduled Meds: . clopidogrel  75 mg Oral Q breakfast  . enoxaparin  (LOVENOX) injection  30 mg Subcutaneous Q24H  . finasteride  5 mg Oral q morning - 10a  . fluticasone  2 spray Each Nare Daily  . hydrochlorothiazide  25 mg Oral q morning - 10a  . isosorbide mononitrate  15 mg Oral Daily  . loratadine  10 mg Oral q morning - 10a  . losartan  25 mg Oral Q breakfast  . metoprolol succinate  25 mg Oral Daily  . multivitamin with minerals  1 tablet Oral q morning - 10a  . potassium gluconate  595 mg Oral q morning - 10a  . simvastatin  20 mg Oral QHS  . sodium chloride  3 mL Intravenous Q12H  . tamsulosin  0.4 mg Oral QHS   Continuous Infusions:     Assessment/Plan:  Acute multifactorial hypoxic respiratory failure secondary high output heart failure from anemia [component of pulmonary fibrosis/emphysema with underlying acute /chronic progressive diastolic dysfunction chronic] -Anti-double-stranded DNA is abnormal at 26-unclear how to interpret this given his advanced age, sjogeren, anti-scleroderma,rheumatoid negative -Echo 2/5 shows mild mitral valve stenosis grade 1 diastolic dysfunction-patient not clinically overtly volume overloaded -Lasix on hold for now -Appreciate pulmonology input, appreciate cardiology input -Will eventually need cardiac catheterization to determine right and left heart pressures-this sounde like it is scheduled for 08/10/13 at [facility] Acute on stage II chronic kidney disease  - Likely secondary to diuretics and ARB.  -Temporarily hold diuretics - Patient received contrast during CTA chest this admission.  Hypokalemia  - replete and follow BMP  Normocytic Anemia, potentially of chronic disease  - hemoglobin dropped since admit - Transfused 2 units 08/07/13 -No overt bleeding -Follow CBC in a.m.  -Potentially secondary to multiple cancers, senescence of the bone marrow -Iron studies= ferritin = 514, iron = 51, TIBC = 202, reticulocyte count 4.0, B12 = 60 Hypertension  - Controlled.  -d/c Amlodipine 5 in favour Metoprolol 25 xl  as tachycardic -Will decrease dose of ARB and add nitrate -Temporarily hold HCTZ.  Orthostatic hypotension  -Received some IV fluids on admission but these have been discontinued -flomax contributing Left renal pelvic tumor (low-grade papillary urothelial carcinoma)  -  currently he would be too high risk for surgery-I have discussed this in person with Dr. Jasmine December 08/07/13 -Renal ultrasound only shows medical disease as of 08/06/13 History of TIA -Unclear when this event was -Continue Plavix 75 mg for now Hyperlipidemia -Continue statin  Code Status: Full Family Communication: discussed with wife in detail 2/715 at bedside Disposition Plan: inpatient   Verneita Griffes, MD  [facility] Pager [phone] 08/08/2013, 1:42 PM    LOS: 3 days

## 2013-08-08 NOTE — Progress Notes (Signed)
Still SOB with ambulation in hall, HR noted up to 150, non sustained after back to room and resting

## 2013-08-08 NOTE — Progress Notes (Signed)
Subjective:  Patient denies any chest pain states had marked dyspnea on walking to the mailbox. States overall activity is limited. Denies any cardiac workup in the past. Patient noted to have worsening emphysematous changes and mild congestive heart failure secondary to preserved systolic function. Discussed with patient regarding nuclear stress test to rule out any evidence of ischemia his risk and benefits and agrees to proceed  Objective:  Vital Signs in the last 24 hours: Temp:  [97.6 F (36.4 C)-98.9 F (37.2 C)] 98 F (36.7 C) (02/07 0943) Pulse Rate:  [74-105] 74 (02/07 0943) Resp:  [18-20] 20 (02/07 0943) BP: (109-139)/(63-78) 130/72 mmHg (02/07 0943) SpO2:  [96 %-98 %] 98 % (02/07 0943) Weight:  [52.3 kg (115 lb 4.8 oz)] 52.3 kg (115 lb 4.8 oz) (02/07 0549)  Intake/Output from previous day: 02/06 0701 - 02/07 0700 In: 1495 [P.O.:720; Blood:775] Out: 2325 [Urine:2325] Intake/Output from this shift: Total I/O In: 240 [P.O.:240] Out: 200 [Urine:200]  Physical Exam: Neck: no adenopathy, no carotid bruit, no JVD and supple, symmetrical, trachea midline Lungs: Decreased breath sound at bases otherwise good air entry Heart: regular rate and rhythm, S1, S2 normal and Soft systolic murmur noted Abdomen: soft, non-tender; bowel sounds normal; no masses,  no organomegaly Extremities: extremities normal, atraumatic, no cyanosis or edema  Lab Results:  Recent Labs  08/07/13 0338 08/08/13 0815  WBC 15.5* 12.5*  HGB 7.8* 12.1*  PLT 407* 378    Recent Labs  08/07/13 0338 08/08/13 0815  NA 139 139  K 4.1 3.7  CL 102 102  CO2 27 24  GLUCOSE 114* 123*  BUN 35* 27*  CREATININE 1.61* 1.34   No results found for this basename: TROPONINI, CK, MB,  in the last 72 hours Hepatic Function Panel  Recent Labs  08/05/13 1320  PROT 7.1  ALBUMIN 3.3*  AST 23  ALT 16  ALKPHOS 68  BILITOT 0.6   No results found for this basename: CHOL,  in the last 72 hours No results  found for this basename: PROTIME,  in the last 72 hours  Imaging: Imaging results have been reviewed  Cardiac Studies:  Assessment/Plan:   status post acute on chronic hypoxic respiratory failure Compensated congestive heart failure secondary to preserved LV systolic function rule out ischemia Exertional dyspnea secondary to above  COPD/interstitial lung disease Hypertension Hypercholesteremia Chronic kidney disease Anemia Plan Schedule for nuclear stress test in a.m.   LOS: 3 days    Tallon Gertz N 08/08/2013, 12:18 PM

## 2013-08-08 NOTE — Progress Notes (Signed)
No diarrhea since precautions ordered.

## 2013-08-09 LAB — CBC
HCT: 31.8 % — ABNORMAL LOW (ref 39.0–52.0)
Hemoglobin: 11 g/dL — ABNORMAL LOW (ref 13.0–17.0)
MCH: 31.9 pg (ref 26.0–34.0)
MCHC: 34.6 g/dL (ref 30.0–36.0)
MCV: 92.2 fL (ref 78.0–100.0)
PLATELETS: 341 10*3/uL (ref 150–400)
RBC: 3.45 MIL/uL — AB (ref 4.22–5.81)
RDW: 15.4 % (ref 11.5–15.5)
WBC: 9.8 10*3/uL (ref 4.0–10.5)

## 2013-08-09 MED ORDER — SODIUM CHLORIDE 0.9 % IJ SOLN: 3.0000 mL | INTRAMUSCULAR | Status: DC | PRN

## 2013-08-09 MED ORDER — SODIUM CHLORIDE 0.9 % IV SOLN
1.0000 mL/kg/h | INTRAVENOUS | Status: DC
  Administered 2013-08-10: 1 mL/kg/h via INTRAVENOUS

## 2013-08-09 MED ORDER — SODIUM CHLORIDE 0.9 % IV SOLN: 250.0000 mL | INTRAVENOUS | Status: DC | PRN

## 2013-08-09 MED ORDER — ASPIRIN 81 MG PO CHEW
81.0000 mg | CHEWABLE_TABLET | ORAL | Status: AC
  Administered 2013-08-10: 81 mg via ORAL
  Filled 2013-08-09: qty 1

## 2013-08-09 MED ORDER — SODIUM CHLORIDE 0.9 % IJ SOLN
3.0000 mL | Freq: Two times a day (BID) | INTRAMUSCULAR | Status: DC
  Administered 2013-08-09: 3 mL via INTRAVENOUS

## 2013-08-09 MED ORDER — DIAZEPAM 5 MG PO TABS
5.0000 mg | ORAL_TABLET | ORAL | Status: AC
  Administered 2013-08-10: 5 mg via ORAL
  Filled 2013-08-09: qty 1

## 2013-08-09 MED ORDER — SODIUM CHLORIDE 0.9 % IJ SOLN: 3.0000 mL | Freq: Two times a day (BID) | INTRAMUSCULAR | Status: DC

## 2013-08-09 MED ORDER — SODIUM CHLORIDE 0.9 % IV SOLN
INTRAVENOUS | Status: DC
  Administered 2013-08-09: 21:00:00 via INTRAVENOUS

## 2013-08-09 NOTE — Progress Notes (Signed)
Pt to be transferred to [facility] 30.  Report called to [facility].  [facility] arranged, spoke with Crystal.  [facility] expected to arrive to take pt around 3pm.  Pt and wife notified.  Coolidge Breeze, RN 08/09/2013

## 2013-08-09 NOTE — Progress Notes (Signed)
Received report from [facility]. I agree with assessment. Patient is resting comfortably. Will continue to monitor.

## 2013-08-09 NOTE — Progress Notes (Signed)
Pt had formed stool, C Diff precautions removed from orders and taken down from door.  Pt and wife educated.  Coolidge Breeze, RN 08/09/2013

## 2013-08-09 NOTE — Progress Notes (Signed)
Note: This document was prepared with digital dictation and possible smart phrase technology. Any transcriptional errors that result from this process are unintentional.   Jesse Owen [id number] DOB: 1929/11/26 DOA: 08/05/2013 PCP: Vena Austria, MD  Brief narrative: 20 ?, known  Stage IIB T4 N0 melanoma 07/2005 [Breslow's measurement of 4 mm and Clark's level of 4-to the left shoulder] followed by Dr. Delman Cheadle from Dermatology/dr. Alen Blew from Oncology  Prostate cancer, status post external beam radiation around year 2007, followed by Dr. Serita Butcher.  Recent diagnosed [06/2013?] left renal mass s/p retrograde pyelogram, left ureteric stent placement/dilation= low grade papillary urothelial CA History hypertension, secondary tobacco abuse exposure, hyperlipidemia, hypokalemia, admitted 08/05/13 with sub-acute onset shortness of rest and dyspnea Patient states that he's actually been short of rest on and off for the last year and his dermatologist recommended compression stockings last year. Over the past month and probably coinciding with his recent renal biopsy and stent placements he has been progressively more uncomfortable getting ready and going to church because he feels a little winded and subsequently over the past 2 weeks he had progressive dyspnea with shortness of breath and lower extremity swelling. He was worked up initially by pulmonology for was thought to be IPF and cardiology was consulted given this might have also been secondary to diastolic heart failure on echocardiogram performed on this admission  however it was noted that he was progressively anemic and subsequent to blood transfusion his shortness of breath became much better prompting thought of potential high-output cardiac failure Urology saw the patient and determined that he was not a great risk from his urothelial CA and this surgery has been deferred for the time being Cardiology is planning catheterization  at [facility] 2/9 a.m. and patient was transferred to Dr. Merrilee Jansky service as of 2/9 AM  Consultants:  Urology  Cardiology  Pulmonary  Procedures:  Echo 08/06/12  Antibiotics:  None currently   Subjective  Feels much much better after 2 U PRBC 2/6 Diarrhea seems to resolve and C. difficile was negative Wife sitting at bedside    Objective    Interim History: See above  Telemetry: Sinus tach 110-130  Objective: Filed Vitals:   08/08/13 1338 08/08/13 1400 08/08/13 2150 08/09/13 0552  BP: 123/66 112/68 134/76 143/69  Pulse: 101  76 68  Temp: 97.4 F (36.3 C)  98.2 F (36.8 C) 98 F (36.7 C)  TempSrc: Oral  Oral Oral  Resp: 18  18 18   Height:      Weight:      SpO2: 99%  99% 99%    Intake/Output Summary (Last 24 hours) at 08/09/13 1030 Last data filed at 08/09/13 0757  Gross per 24 hour  Intake    890 ml  Output   1700 ml  Net   -810 ml    Exam:  General: EOMI, NCAT, no pallor or rest Cardiovascular: S1-S2 slightly tachycardic Respiratory: No accessory muscle use, clinically clear Abdomen: Soft nontender nondistended no rebound Skin grade 1 lower at edema Neuro grossly intact moving all 4 limbs equally  Data Reviewed: Basic Metabolic Panel:  Recent Labs Lab 08/05/13 1320 08/05/13 1425 08/06/13 0348 08/07/13 0338 08/08/13 0815  NA 137 137 135* 139 139  K 3.4* 3.3* 3.4* 4.1 3.7  CL 93* 97 97 102 102  CO2 28  --  24 27 24   GLUCOSE 137* 132* 219* 114* 123*  BUN 30* 31* 35* 35* 27*  CREATININE 1.67* 1.60* 1.70* 1.61* 1.34  CALCIUM 11.1*  --  9.2 8.5 8.4  MG  --   --  1.7  --   --    Liver Function Tests:  Recent Labs Lab 08/05/13 1320  AST 23  ALT 16  ALKPHOS 68  BILITOT 0.6  PROT 7.1  ALBUMIN 3.3*   No results found for this basename: LIPASE, AMYLASE,  in the last 168 hours No results found for this basename: AMMONIA,  in the last 168 hours CBC:  Recent Labs Lab 08/05/13 1320 08/05/13 1425 08/06/13 0348 08/07/13 0338  08/08/13 0815 08/09/13 0418  WBC 11.3*  --  7.8 15.5* 12.5* 9.8  NEUTROABS 6.9  --   --   --   --   --   HGB 10.0* 11.6* 8.9* 7.8* 12.1* 11.0*  HCT 31.3* 34.0* 27.7* 23.8* 36.9* 31.8*  MCV 94.0  --  93.9 94.4 92.9 92.2  PLT 514*  --  434* 407* 378 341   Cardiac Enzymes: No results found for this basename: CKTOTAL, CKMB, CKMBINDEX, TROPONINI,  in the last 168 hours BNP: No components found with this basename: POCBNP,  CBG: No results found for this basename: GLUCAP,  in the last 168 hours  No results found for this or any previous visit (from the past 240 hour(s)).   Studies:              All Imaging reviewed and is as per above notation   Scheduled Meds: . clopidogrel  75 mg Oral Q breakfast  . enoxaparin (LOVENOX) injection  40 mg Subcutaneous Q24H  . finasteride  5 mg Oral q morning - 10a  . fluticasone  2 spray Each Nare Daily  . hydrochlorothiazide  25 mg Oral q morning - 10a  . isosorbide mononitrate  15 mg Oral Daily  . loratadine  10 mg Oral q morning - 10a  . losartan  25 mg Oral Q breakfast  . metoprolol succinate  25 mg Oral Daily  . multivitamin with minerals  1 tablet Oral q morning - 10a  . potassium gluconate  595 mg Oral q morning - 10a  . simvastatin  20 mg Oral QHS  . sodium chloride  3 mL Intravenous Q12H  . tamsulosin  0.4 mg Oral QHS   Continuous Infusions:     Assessment/Plan:  Acute multifactorial hypoxic respiratory failure secondary high output heart failure from anemia [component of pulmonary fibrosis/emphysema with underlying acute /chronic progressive diastolic dysfunction chronic] -Anti-double-stranded DNA is abnormal at 26-unclear how to interpret this given his advanced age, sjogeren, anti-scleroderma,rheumatoid negative -Echo 2/5 shows mild mitral valve stenosis grade 1 diastolic dysfunction-patient not clinically overtly volume overloaded -Lasix on hold -Appreciate pulmonology input, appreciate cardiology input -Will eventually need  cardiac catheterization to determine right and left heart pressures-this sounde like it is scheduled for 08/10/13 at [facility] -Patient transferred to Dr. Merrilee Jansky service as of 2/9 Acute on stage II chronic kidney disease  - Likely secondary to diuretics and ARB.  -Temporarily hold diuretics - Patient received contrast during CTA chest this admission.  Hypokalemia  - replete and follow BMP  Normocytic Anemia, potentially of chronic disease  - hemoglobin dropped since admit - Transfused 2 units 08/07/13 -No overt bleeding on Hemoccult-might benefit from interval colonoscopy? -Follow CBC in a.m.  -Potentially secondary to multiple cancers, senescence of the bone marrow -Iron studies= ferritin = 514, iron = 51, TIBC = 202, reticulocyte count 4.0, B12 = 60 Hypertension  - Controlled.  -d/c Amlodipine 5 in favour Metoprolol  25 xl as tachycardic -Will decrease dose of ARB and add nitrate -Temporarily hold HCTZ.  Orthostatic hypotension  -Received some IV fluids on admission but these have been discontinued -flomax contributing potentially to orthostasis in addition to anemia Left renal pelvic tumor (low-grade papillary urothelial carcinoma)  -  currently he would be too high risk for surgery-I have discussed this in person with Dr. Jasmine December 08/07/13 -Renal ultrasound only shows medical disease as of 08/06/13 History of TIA -Unclear when this event was -Continue Plavix 75 mg for now Hyperlipidemia -Continue statin  Code Status: Full Family Communication: discussed with wife in detail 08/10/13 and I will forward a note to his primary urologist as well as consulting pulmonologist for continuation of care Disposition Plan: inpatient-transferred to [facility]   Verneita Griffes, MD  [facility] Pager [phone] 08/09/2013, 10:30 AM    LOS: 4 days

## 2013-08-09 NOTE — Progress Notes (Signed)
Subjective:  Patient is any chest pain states breathing is improved. Patient decided to proceed with cardiac cath and refused to for a stress test after discussing with his wife  Objective:  Vital Signs in the last 24 hours: Temp:  [97.4 F (36.3 C)-98.2 F (36.8 C)] 98 F (36.7 C) (02/08 0552) Pulse Rate:  [68-101] 68 (02/08 0552) Resp:  [18] 18 (02/08 0552) BP: (112-143)/(66-76) 143/69 mmHg (02/08 0552) SpO2:  [98 %-99 %] 98 % (02/08 1045) Weight:  [78.427 kg (172 lb 14.4 oz)] 78.427 kg (172 lb 14.4 oz) (02/08 1045)  Intake/Output from previous day: 02/07 0701 - 02/08 0700 In: 62 [P.O.:990] Out: 1900 [Urine:1900] Intake/Output from this shift: Total I/O In: 240 [P.O.:240] Out: -   Physical Exam: Neck: no adenopathy, no carotid bruit, no JVD and supple, symmetrical, trachea midline Lungs: Decreased breath sound at bases Heart: regular rate and rhythm, S1, S2 normal and Soft systolic murmur noted no S3 gallop Abdomen: soft, non-tender; bowel sounds normal; no masses,  no organomegaly Extremities: extremities normal, atraumatic, no cyanosis or edema  Lab Results:  Recent Labs  08/08/13 0815 08/09/13 0418  WBC 12.5* 9.8  HGB 12.1* 11.0*  PLT 378 341    Recent Labs  08/07/13 0338 08/08/13 0815  NA 139 139  K 4.1 3.7  CL 102 102  CO2 27 24  GLUCOSE 114* 123*  BUN 35* 27*  CREATININE 1.61* 1.34   No results found for this basename: TROPONINI, CK, MB,  in the last 72 hours Hepatic Function Panel No results found for this basename: PROT, ALBUMIN, AST, ALT, ALKPHOS, BILITOT, BILIDIR, IBILI,  in the last 72 hours No results found for this basename: CHOL,  in the last 72 hours No results found for this basename: PROTIME,  in the last 72 hours  Imaging: Imaging results have been reviewed and No results found.  Cardiac Studies:  Assessment/Plan:  status post acute on chronic hypoxic respiratory failure  Compensated congestive heart failure secondary to  preserved LV systolic function rule out ischemia  Exertional dyspnea secondary to above  COPD/interstitial lung disease  Hypertension  Hypercholesteremia  Chronic kidney disease  Anemia  Plan Schedule for right and left cardiac cath in a.m. Consider transfer to [facility] hospital  LOS: 4 days    Clent Demark 08/09/2013, 10:56 AM

## 2013-08-10 ENCOUNTER — Encounter (HOSPITAL_COMMUNITY)
Admission: EM | Disposition: A | Discharge status: Home / Self Care | Payer: Self-pay | Source: Home / Self Care | Attending: Family Medicine

## 2013-08-10 HISTORY — PX: LEFT HEART CATHETERIZATION WITH CORONARY ANGIOGRAM: SHX5451

## 2013-08-10 LAB — POCT I-STAT 3, VENOUS BLOOD GAS (G3P V)
Acid-base deficit: 3 mmol/L — ABNORMAL HIGH (ref 0.0–2.0)
Acid-base deficit: 3 mmol/L — ABNORMAL HIGH (ref 0.0–2.0)
Bicarbonate: 20.1 mEq/L (ref 20.0–24.0)
Bicarbonate: 21.5 mEq/L (ref 20.0–24.0)
O2 Saturation: 66 %
O2 Saturation: 97 %
PCO2 VEN: 36.7 mmHg — AB (ref 45.0–50.0)
PH VEN: 7.428 — AB (ref 7.250–7.300)
TCO2: 21 mmol/L (ref 0–100)
TCO2: 23 mmol/L (ref 0–100)
pCO2, Ven: 30.4 mmHg — ABNORMAL LOW (ref 45.0–50.0)
pH, Ven: 7.376 — ABNORMAL HIGH (ref 7.250–7.300)
pO2, Ven: 35 mmHg (ref 30.0–45.0)
pO2, Ven: 85 mmHg — ABNORMAL HIGH (ref 30.0–45.0)

## 2013-08-10 LAB — COMPREHENSIVE METABOLIC PANEL
ALBUMIN: 2.2 g/dL — AB (ref 3.5–5.2)
ALT: 17 U/L (ref 0–53)
AST: 16 U/L (ref 0–37)
Alkaline Phosphatase: 52 U/L (ref 39–117)
BUN: 27 mg/dL — ABNORMAL HIGH (ref 6–23)
CALCIUM: 7.8 mg/dL — AB (ref 8.4–10.5)
CO2: 22 meq/L (ref 19–32)
Chloride: 107 mEq/L (ref 96–112)
Creatinine, Ser: 1.33 mg/dL (ref 0.50–1.35)
GFR calc Af Amer: 56 mL/min — ABNORMAL LOW (ref >90)
GFR calc non Af Amer: 48 mL/min — ABNORMAL LOW (ref >90)
Glucose, Bld: 95 mg/dL (ref 70–99)
Potassium: 3.9 mEq/L (ref 3.7–5.3)
SODIUM: 141 meq/L (ref 137–147)
TOTAL PROTEIN: 5.4 g/dL — AB (ref 6.0–8.3)
Total Bilirubin: 0.3 mg/dL (ref 0.3–1.2)

## 2013-08-10 LAB — ANA: Anti Nuclear Antibody(ANA): POSITIVE — AB

## 2013-08-10 LAB — CBC
HCT: 32.1 % — ABNORMAL LOW (ref 39.0–52.0)
Hemoglobin: 10.6 g/dL — ABNORMAL LOW (ref 13.0–17.0)
MCH: 30.7 pg (ref 26.0–34.0)
MCHC: 33 g/dL (ref 30.0–36.0)
MCV: 93 fL (ref 78.0–100.0)
Platelets: 311 10*3/uL (ref 150–400)
RBC: 3.45 MIL/uL — AB (ref 4.22–5.81)
RDW: 15.3 % (ref 11.5–15.5)
WBC: 8.4 10*3/uL (ref 4.0–10.5)

## 2013-08-10 LAB — POCT ACTIVATED CLOTTING TIME: ACTIVATED CLOTTING TIME: 110 s

## 2013-08-10 LAB — ANTI-NUCLEAR AB-TITER (ANA TITER): ANA Titer 1: 1:320 {titer} — ABNORMAL HIGH

## 2013-08-10 LAB — PROTIME-INR
INR: 0.95 (ref 0.00–1.49)
PROTHROMBIN TIME: 12.5 s (ref 11.6–15.2)

## 2013-08-10 SURGERY — LEFT HEART CATHETERIZATION WITH CORONARY ANGIOGRAM
Anesthesia: LOCAL

## 2013-08-10 MED ORDER — LIDOCAINE HCL (PF) 1 % IJ SOLN
INTRAMUSCULAR | Status: AC
  Filled 2013-08-10: qty 30

## 2013-08-10 MED ORDER — SODIUM CHLORIDE 0.9 % IV SOLN: 1.0000 mL/kg/h | INTRAVENOUS | Status: AC

## 2013-08-10 MED ORDER — FENTANYL CITRATE 0.05 MG/ML IJ SOLN
INTRAMUSCULAR | Status: AC
  Filled 2013-08-10: qty 2

## 2013-08-10 MED ORDER — NITROGLYCERIN 0.2 MG/ML ON CALL CATH LAB
INTRAVENOUS | Status: AC
  Filled 2013-08-10: qty 1

## 2013-08-10 MED ORDER — HEPARIN (PORCINE) IN NACL 2-0.9 UNIT/ML-% IJ SOLN
INTRAMUSCULAR | Status: AC
  Filled 2013-08-10: qty 1000

## 2013-08-10 MED ORDER — MIDAZOLAM HCL 2 MG/2ML IJ SOLN
INTRAMUSCULAR | Status: AC
  Filled 2013-08-10: qty 2

## 2013-08-10 MED ORDER — HEPARIN SODIUM (PORCINE) 1000 UNIT/ML IJ SOLN
INTRAMUSCULAR | Status: AC
  Filled 2013-08-10: qty 1

## 2013-08-10 NOTE — Progress Notes (Signed)
Pt/wife wanted to speak to Dr. Doylene Canard to find out what the plan was for his care.  Wife states he already has appointments set up with lung Dr., Nephrologist and PCP. Will return in the morning. Pt resting with call bell within reach.  Will continue to monitor. Jesse Owen

## 2013-08-10 NOTE — H&P (View-Only) (Signed)
Subjective:  Further drop in hemoglobin this AM. Received 2 units of Packed RBC transfused. Afebrile.  Objective:  Vital Signs in the last 24 hours: Temp:  [97.7 F (36.5 C)-98.9 F (37.2 C)] 98.9 F (37.2 C) (02/06 1810) Pulse Rate:  [80-103] 103 (02/06 1810) Cardiac Rhythm:  [-] Sinus tachycardia (02/06 0845) Resp:  [18-20] 18 (02/06 1810) BP: (109-149)/(59-76) 129/64 mmHg (02/06 1810) SpO2:  [96 %-98 %] 98 % (02/06 1810) Weight:  [52.2 kg (115 lb 1.3 oz)] 52.2 kg (115 lb 1.3 oz) (02/06 0516)  Physical Exam: BP Readings from Last 1 Encounters:  08/07/13 129/64     Wt Readings from Last 1 Encounters:  08/07/13 52.2 kg (115 lb 1.3 oz)    Weight change: -26.499 kg (-58 lb 6.7 oz)  HEENT: [place]/AT, Eyes-Hazel, PERL, EOMI, Conjunctiva-Pale pink, Sclera-Non-icteric Neck: No JVD, No bruit, Trachea midline. Lungs:  Few basal fine crackles, Bilateral. Cardiac:  Regular rhythm, normal S1 and S2, no S3. III/VI systolic murmur  Abdomen:  Soft, non-tender. Extremities:  No edema present. No cyanosis. No clubbing. CNS: AxOx3, Cranial nerves grossly intact, moves all 4 extremities. Right handed. Skin: Warm and dry.   Intake/Output from previous day: 02/05 0701 - 02/06 0700 In: 720 [P.O.:720] Out: 1075 [Urine:1075]    Lab Results: BMET    Component Value Date/Time   NA 139 08/07/2013 0338   NA 138 10/14/2012 0940   K 4.1 08/07/2013 0338   K 3.8 10/14/2012 0940   CL 102 08/07/2013 0338   CL 100 10/14/2012 0940   CO2 27 08/07/2013 0338   CO2 26 10/14/2012 0940   GLUCOSE 114* 08/07/2013 0338   GLUCOSE 129* 10/14/2012 0940   BUN 35* 08/07/2013 0338   BUN 25.7 10/14/2012 0940   CREATININE 1.61* 08/07/2013 0338   CREATININE 1.2 10/14/2012 0940   CALCIUM 8.5 08/07/2013 0338   CALCIUM 9.7 10/14/2012 0940   GFRNONAA 38* 08/07/2013 0338   GFRAA 45* 08/07/2013 0338   CBC    Component Value Date/Time   WBC 15.5* 08/07/2013 0338   WBC 9.6 10/14/2012 0940   RBC 2.52* 08/07/2013 0338   RBC 2.95* 08/06/2013 0348   RBC 4.39 10/14/2012 0940   HGB 7.8* 08/07/2013 0338   HGB 13.5 10/14/2012 0940   HCT 23.8* 08/07/2013 0338   HCT 39.7 10/14/2012 0940   PLT 407* 08/07/2013 0338   PLT 326 10/14/2012 0940   MCV 94.4 08/07/2013 0338   MCV 90.4 10/14/2012 0940   MCH 31.0 08/07/2013 0338   MCH 30.7 10/14/2012 0940   MCHC 32.8 08/07/2013 0338   MCHC 33.9 10/14/2012 0940   RDW 14.5 08/07/2013 0338   RDW 13.0 10/14/2012 0940   LYMPHSABS 2.9 08/05/2013 1320   LYMPHSABS 2.8 10/14/2012 0940   MONOABS 1.0 08/05/2013 1320   MONOABS 0.9 10/14/2012 0940   EOSABS 0.4 08/05/2013 1320   EOSABS 0.6* 10/14/2012 0940   BASOSABS 0.1 08/05/2013 1320   BASOSABS 0.1 10/14/2012 0940   CARDIAC ENZYMES No results found for this basename: CKTOTAL, CKMB, CKMBINDEX, TROPONINI    Scheduled Meds: . amLODipine  5 mg Oral q morning - 10a  . clopidogrel  75 mg Oral Q breakfast  . enoxaparin (LOVENOX) injection  30 mg Subcutaneous Q24H  . finasteride  5 mg Oral q morning - 10a  . fluticasone  2 spray Each Nare Daily  . [START ON 08/08/2013] hydrochlorothiazide  25 mg Oral q morning - 10a  . [START ON 08/08/2013] isosorbide mononitrate  15 mg   Oral Daily  . loratadine  10 mg Oral q morning - 10a  . [START ON 08/08/2013] losartan  25 mg Oral Q breakfast  . multivitamin with minerals  1 tablet Oral q morning - 10a  . potassium gluconate  595 mg Oral q morning - 10a  . senna-docusate  1 tablet Oral QHS  . simvastatin  20 mg Oral QHS  . sodium chloride  3 mL Intravenous Q12H  . tamsulosin  0.4 mg Oral QHS   Continuous Infusions:  PRN Meds:.acetaminophen, acetaminophen, albuterol, ondansetron (ZOFRAN) IV, ondansetron, oxyCODONE-acetaminophen, phenazopyridine, polyvinyl alcohol  Assessment/Plan: Acute respiratory failure  Acute blood loss anemia  Interstitial pulmonary fibrosis  Emphysema lung  Hypokalemia  Anemia  Acute-on-chronic renal failure  Hypertension  Hyperlipemia  Continue medical treatment.   LOS: 2 days    Dixie Dials  MD  08/07/2013, 7:26  PM

## 2013-08-10 NOTE — Interval H&P Note (Signed)
History and Physical Interval Note:  08/10/2013 7:12 AM  Jesse Owen  has presented today for surgery, with the diagnosis of cp  The various methods of treatment have been discussed with the patient and family. After consideration of risks, benefits and other options for treatment, the patient has consented to  Procedure(s): LEFT HEART CATHETERIZATION WITH CORONARY ANGIOGRAM (N/A) as a surgical intervention .  The patient's history has been reviewed, patient examined, no change in status, stable for surgery.  I have reviewed the patient's chart and labs.  Questions were answered to the patient's satisfaction.     Alsace Dowd S

## 2013-08-10 NOTE — CV Procedure (Signed)
PROCEDURE:  Right and Left heart catheterization with selective coronary angiography, left ventriculogram and cardiac output study.  CLINICAL HISTORY:  This is a 80 year old male with exertional dyspnea and non-specific T wave changes, possible pulmonary fibrosis .  The risks, benefits, and details of the procedure were explained to the patient.  The patient verbalized understanding and wanted to proceed.  Informed written consent was obtained.  PROCEDURE TECHNIQUE:  The patient was approached from the right femoral artery using a 6 French Long sheath.  Left coronary angiography was done using a Voda 3.5 guide catheter.  Right coronary angiography was done using a Judkins R4 guide catheter.  Left ventriculography was done using a pigtail catheter. Right heart pressures and output were done using a Swan Ganze catheter through 7 French right femoral vein   CONTRAST:  Total of 100 cc.  COMPLICATIONS:  None.  At the end of the procedure a manual pressure was used for hemostasis.    HEMODYNAMICS:  Aortic pressure was 125/63; LV pressure was 120/2; LVEDP 6.  There was no gradient between the left ventricle and aorta.  PA was 25/7 after fluid bolus. Wedge was 6, RV was 29/1 and RA was 8/3 after one litre of fluid bolus. Oxygen saturation was 97 % in AO and 66 % in PA. Cardiac output was 4.49 L/Min by thermodilution and 5.65 by Fick method.  ANGIOGRAM/CORONARY ARTERIOGRAM:   The left main coronary artery has mild calcification otherwise  unremarkable.  The left anterior descending artery has mild calcification and proximal luminal irregularities.  The left circumflex artery is unremarkable.  The right coronary artery is dominant and unremarkable.  LEFT VENTRICULOGRAM:  Left ventricular angiogram was done in the 30 RAO projection and revealed normal left ventricular wall motion and systolic function with an estimated ejection fraction of 55-60%.  LVEDP was 6 mmHg.  IMPRESSION OF HEART  CATHETERIZATION:   1. Near normal left main coronary artery. 2. Minimal disease of left anterior descending artery and its branches. 3. Normal left circumflex artery and its branches. 4. Normal right coronary artery. 5. Normal left ventricular systolic function.  LVEDP 6 mmHg.  Ejection fraction 55-60%. 6.  Low right heart and wedge pressure suggesting dehydration.  RECOMMENDATION:   Medical treatment.

## 2013-08-11 LAB — CBC
HCT: 31.2 % — ABNORMAL LOW (ref 39.0–52.0)
HEMOGLOBIN: 10.2 g/dL — AB (ref 13.0–17.0)
MCH: 30.4 pg (ref 26.0–34.0)
MCHC: 32.7 g/dL (ref 30.0–36.0)
MCV: 93.1 fL (ref 78.0–100.0)
PLATELETS: 287 10*3/uL (ref 150–400)
RBC: 3.35 MIL/uL — AB (ref 4.22–5.81)
RDW: 15.1 % (ref 11.5–15.5)
WBC: 9.1 10*3/uL (ref 4.0–10.5)

## 2013-08-11 LAB — BASIC METABOLIC PANEL
BUN: 20 mg/dL (ref 6–23)
CALCIUM: 7.7 mg/dL — AB (ref 8.4–10.5)
CO2: 20 mEq/L (ref 19–32)
CREATININE: 1.11 mg/dL (ref 0.50–1.35)
Chloride: 108 mEq/L (ref 96–112)
GFR calc Af Amer: 70 mL/min — ABNORMAL LOW (ref >90)
GFR, EST NON AFRICAN AMERICAN: 60 mL/min — AB (ref >90)
GLUCOSE: 95 mg/dL (ref 70–99)
Potassium: 3.9 mEq/L (ref 3.7–5.3)
SODIUM: 141 meq/L (ref 137–147)

## 2013-08-11 MED ORDER — FERROUS SULFATE 325 (65 FE) MG PO TABS
325.0000 mg | ORAL_TABLET | Freq: Every day | ORAL | Status: DC
  Filled 2013-08-11: qty 1

## 2013-08-11 MED ORDER — ALBUTEROL SULFATE (2.5 MG/3ML) 0.083% IN NEBU: 2.5000 mg | INHALATION_SOLUTION | RESPIRATORY_TRACT | Status: DC

## 2013-08-11 MED ORDER — ALBUTEROL SULFATE HFA 108 (90 BASE) MCG/ACT IN AERS: 2.0000 | INHALATION_SPRAY | RESPIRATORY_TRACT | Status: AC

## 2013-08-11 MED ORDER — LOSARTAN POTASSIUM 100 MG PO TABS: 50.0000 mg | ORAL_TABLET | Freq: Every day | ORAL | Status: AC

## 2013-08-11 MED ORDER — FERROUS SULFATE 325 (65 FE) MG PO TABS: 325.0000 mg | ORAL_TABLET | Freq: Every day | ORAL | Status: AC

## 2013-08-11 NOTE — Discharge Summary (Signed)
Physician Discharge Summary  Patient ID: Jesse Owen MRN: [id number] DOB/AGE: [birth year] 78 y.o.  Admit date: 08/05/2013 Discharge date: 08/11/2013  Admission Diagnoses: Acute respiratory failure  Acute blood loss anemia  Interstitial pulmonary fibrosis  Emphysema lung  Hypokalemia  Anemia  Acute-on-chronic renal failure  Hypertension  Hyperlipemia  Discharge Diagnoses:  Principal Problem:   Acute respiratory failure with hypoxia Active Problems:   Interstitial pulmonary fibrosis   Emphysema lung   Hypokalemia   Anemia, iron deficiency and blood loss   Dyspnea from anemia and emphysema   Acute-on-chronic renal failure   Hypertension   Hyperlipemia   Dehydration-improved   Possible Lupus   S/P Prostate cancer   S/P Low grade papillary urothelial carcinoa  Discharged Condition: fair  Hospital Course: 80 y.o. 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 left nephrectomy on 09/09/13 presented to the ED with complaints of worsening dyspnea. According to patient and spouse, no prior history of dyspnea although patient says that at times he might have had some dyspnea. Patient underwent renal biopsy on 1/14 and since then has been progressively getting weaker and less active. 3-4 days back, patient started noticing dyspnea on exertion. This was not associated with fever, chills, cough, wheezing, chest tightness or chest pain. Dyspnea got worse last night and ambulating even 50-100 steps to and for from his mailbox, made him significantly worse and he collapsed on a chair. In hospital his Hemoglobin was down to 7.8 and with 2 units blood transfusion it was up to 10-12 mg/dL. His ANA and anti-DNA were positive and Sjogrens A and B, Scleroderma and rheumatoid factor were negative. He underwent cardiac cath which was difficult due to significant aortic tortuosity  but with calcific coronary artery disease without significant stenosis. His renal function improved with hydration. He do not have significant pulmonary hypertension ( low wedge and PA pressures when he was dehydrated) His emphysema will be treated with albuterol MDI as needed and other medications when he sees Dr. Melvyn Novas on 08/18/2013. He also has primary care follow up and nephrology follow up. He will be seen by me in 1 week.  Consults: cardiology  Significant Diagnostic Studies: labs: Low Hgb of 7.8 to 12 mg/dL. Near normal WBC and platelets counts. Elevated glucose and BUN/Cr to normal glucose and BUN/Cr by discharge day.  EKG-Sinus tachycardia.  CXR-Bibasilar atelectasis.  CT of chest : Underlying emphysema with areas of interstitial fibrosis, progressed from 2010. Evidence of bibasilar edema; question a degree of underlying congestive heart failure superimposed on emphysematous change. Small area of infiltrate in the inferior lingula, either representing alveolar edema or possibly small focus of pneumonia.  No demonstrable pulmonary embolus.  Stable 2 mm nodular opacity on the right. Stability since 2010 is consistent with benign etiology.   Cardiac cath with low right heart pressures from dehydration and near normal coronaries.   Treatments: IV hydration, cardiac meds: Losartan and Simvastatin and respiratory therapy: albuterol/atropine nebulizer. Blood transfusion. Awaiting further pulmonary and Urology work-up/treatments.  Discharge Exam: Blood pressure 119/74, pulse 100, temperature 98 F (36.7 C), temperature source Oral, resp. rate 18, height 5\' 7"  (1.702 m), weight 78.2 kg (172 lb 6.4 oz), SpO2 98.00%. Physical exam: HEENT: [place]/AT, Eyes-Hazel, PERL, EOMI, Conjunctiva-Pale pink, Sclera-Non-icteric  Neck: No JVD, No bruit, Trachea midline.  Lungs: Few basal fine crackles, Bilateral.  Cardiac: Regular rhythm, normal S1 and S2, no S3. III/VI systolic murmur  Abdomen: Soft,  non-tender.  Extremities: No edema present. No cyanosis. No clubbing. No right groin hematoma. CNS: AxOx3, Cranial nerves grossly intact, moves all 4 extremities. Right handed.  Skin: Warm and dry.  Disposition: 01-Home or Self Care   Future Appointments Provider Department Dept Phone   08/18/2013 2:30 PM Tanda Rockers, MD [facility] [phone]   10/15/2013 10:00 AM [facility] [phone]   10/15/2013 10:30 AM Wyatt Portela, MD [facility] [phone]       Medication List    STOP taking these medications       amLODipine 10 MG tablet  Commonly known as:  NORVASC     clopidogrel 75 MG tablet  Commonly known as:  PLAVIX     furosemide 40 MG tablet  Commonly known as:  LASIX     hydrochlorothiazide 25 MG tablet  Commonly known as:  HYDRODIURIL     phenazopyridine 200 MG tablet  Commonly known as:  PYRIDIUM      TAKE these medications       CALCIUM 600 + D PO  Take 1 tablet by mouth 2 (two) times daily.     finasteride 5 MG tablet  Commonly known as:  PROSCAR  Take 5 mg by mouth every morning.     flunisolide 29 MCG/ACT (0.025%) nasal spray  Commonly known as:  NASAREL  Place 2 sprays into the nose daily as needed for rhinitis or allergies. Dose is for each nostril.     hydrocortisone cream 1 %  Apply 1 application topically 2 (two) times daily as needed (Applies to back for itching.).     loratadine 10 MG tablet  Commonly known as:  CLARITIN  Take 10 mg by mouth every morning.     losartan 100 MG tablet  Commonly known as:  COZAAR  Take 0.5 tablets (50 mg total) by mouth daily with breakfast.     multivitamin with minerals Tabs tablet  Take 1 tablet by mouth every morning.     OSTEO BI-FLEX JOINT SHIELD PO  Take 1 tablet by mouth 2 (two) times daily.     oxyCODONE-acetaminophen 5-325 MG per tablet  Commonly known as:  PERCOCET/ROXICET  Take 1-2 tablets by  mouth every 4 (four) hours as needed for severe pain.     polyvinyl alcohol 1.4 % ophthalmic solution  Commonly known as:  LIQUIFILM TEARS  Place 2 drops into both eyes daily as needed for dry eyes.     potassium gluconate 595 MG Tabs tablet  Take 595 mg by mouth every morning.     sennosides-docusate sodium 8.6-50 MG tablet  Commonly known as:  SENOKOT-S  Take 1 tablet by mouth at bedtime.     simvastatin 40 MG tablet  Commonly known as:  ZOCOR  Take 20 mg by mouth at bedtime.     tamsulosin 0.4 MG Caps capsule  Commonly known as:  FLOMAX  Take 1 capsule (0.4 mg total) by mouth at bedtime.         Ferrous sulfate 325 mg. One in AM.      Albuterol MDI, 2 puffs 4 times daily as needed.       Follow-up Information   Follow up with Christinia Gully, MD On 08/18/2013. (2:00pm )    Specialty:  Pulmonary Disease   Contact information:   [address] [place] [address] [phone]       Follow up with [facility]  S, MD. Schedule an appointment as soon as possible for a visit in 1 week.   Specialty:  Cardiology   Contact information:   [place] [address] [phone]       Signed: Birdie Riddle 08/11/2013, 8:27 AM

## 2013-08-11 NOTE — Progress Notes (Signed)
Discharge paper work reviewed with patient and family; prescriptions given to spouse; no further questions from patient or spouse; DV IV and tele per orders; called central tele to notify; called volunteers to pick up patient.  BARNETT, Marsh Dolly

## 2013-08-18 ENCOUNTER — Institutional Professional Consult (permissible substitution): Payer: Medicare Other | Admitting: Internal Medicine

## 2013-08-24 ENCOUNTER — Encounter: Payer: Self-pay | Admitting: Internal Medicine

## 2013-08-24 ENCOUNTER — Ambulatory Visit (INDEPENDENT_AMBULATORY_CARE_PROVIDER_SITE_OTHER)
Admission: RE | Admit: 2013-08-24 | Discharge: 2013-08-24 | Disposition: A | Discharge status: Home / Self Care | Payer: Medicare Other | Source: Ambulatory Visit | Attending: Internal Medicine | Admitting: Internal Medicine

## 2013-08-24 ENCOUNTER — Ambulatory Visit (INDEPENDENT_AMBULATORY_CARE_PROVIDER_SITE_OTHER): Payer: Medicare Other | Admitting: Internal Medicine

## 2013-08-24 ENCOUNTER — Other Ambulatory Visit (INDEPENDENT_AMBULATORY_CARE_PROVIDER_SITE_OTHER): Payer: Medicare Other

## 2013-08-24 VITALS — BP 130/84 | HR 105 | Temp 98.5°F | Ht 67.0 in | Wt 178.0 lb

## 2013-08-24 DIAGNOSIS — R0609 Other forms of dyspnea: Secondary | ICD-10-CM

## 2013-08-24 DIAGNOSIS — J8410 Pulmonary fibrosis, unspecified: Secondary | ICD-10-CM

## 2013-08-24 DIAGNOSIS — R0989 Other specified symptoms and signs involving the circulatory and respiratory systems: Secondary | ICD-10-CM

## 2013-08-24 DIAGNOSIS — R0600 Dyspnea, unspecified: Secondary | ICD-10-CM | POA: Insufficient documentation

## 2013-08-24 LAB — CBC WITH DIFFERENTIAL/PLATELET
BASOS PCT: 0.7 % (ref 0.0–3.0)
Basophils Absolute: 0.1 10*3/uL (ref 0.0–0.1)
EOS ABS: 0.7 10*3/uL (ref 0.0–0.7)
EOS PCT: 8.3 % — AB (ref 0.0–5.0)
HCT: 36 % — ABNORMAL LOW (ref 39.0–52.0)
HEMOGLOBIN: 12.1 g/dL — AB (ref 13.0–17.0)
LYMPHS PCT: 18.8 % (ref 12.0–46.0)
Lymphs Abs: 1.5 10*3/uL (ref 0.7–4.0)
MCHC: 33.6 g/dL (ref 30.0–36.0)
MCV: 93.7 fl (ref 78.0–100.0)
Monocytes Absolute: 0.7 10*3/uL (ref 0.1–1.0)
Monocytes Relative: 9 % (ref 3.0–12.0)
NEUTROS ABS: 5.2 10*3/uL (ref 1.4–7.7)
Neutrophils Relative %: 63.2 % (ref 43.0–77.0)
Platelets: 356 10*3/uL (ref 150.0–400.0)
RBC: 3.84 Mil/uL — AB (ref 4.22–5.81)
RDW: 15.4 % — ABNORMAL HIGH (ref 11.5–14.6)
WBC: 8.2 10*3/uL (ref 4.5–10.5)

## 2013-08-24 LAB — BASIC METABOLIC PANEL
BUN: 15 mg/dL (ref 6–23)
CHLORIDE: 106 meq/L (ref 96–112)
CO2: 28 meq/L (ref 19–32)
Calcium: 10 mg/dL (ref 8.4–10.5)
Creatinine, Ser: 1.3 mg/dL (ref 0.4–1.5)
GFR: 57.73 mL/min — ABNORMAL LOW (ref >60.00)
GLUCOSE: 105 mg/dL — AB (ref 70–99)
POTASSIUM: 4.3 meq/L (ref 3.5–5.1)
Sodium: 140 mEq/L (ref 135–145)

## 2013-08-24 LAB — BRAIN NATRIURETIC PEPTIDE: PRO B NATRI PEPTIDE: 60 pg/mL (ref 0.0–100.0)

## 2013-08-24 LAB — SEDIMENTATION RATE: SED RATE: 80 mm/h — AB (ref 0–22)

## 2013-08-24 NOTE — Assessment & Plan Note (Addendum)
-    ESR 80 08/24/2013 vs BNP 60    Lab Results  Component Value Date   ANA POSITIVE* 08/06/2013   with Pos homogenous pattern and 1: 320 dilution   DDx for pulmonary fibrosis  includes idiopathic pulmonary fibrosis, pulmonary fibrosis associated with rheumatologic diseases (which have a relatively benign course in most cases and note the assoc elevated esr/ ana/antidna) , adverse effect from  drugs such as chemotherapy or amiodarone exposure, nonspecific interstitial pneumonia which is typically steroid responsive, and chronic hypersensitivity pneumonitis.   In active  smokers Langerhan's Cell  Histiocyctosis (eosinophilic granuomatosis),  DIP,  and Respiratory Bronchiolitis ILD also need to be considered,    I strongly rec he see rheum next and hold off on rx for now.

## 2013-08-24 NOTE — Progress Notes (Signed)
Subjective:     Patient ID: Jesse Owen, male   DOB: Dec 03, 1929 MRN: [id number]  HPI   50 yowm never smoker pt of Jesse Owen slowed down by arthritis not breathing since around 2010 then Jan 14th p ureteral bx (low grade ca not yet rx)  Much weaker then increased leg swelling x sev weeks then admit   Admit date: 08/05/2013  Discharge date: 08/11/2013  Admission Diagnoses:  Acute respiratory failure  Acute blood loss anemia  Interstitial pulmonary fibrosis  Emphysema lung  Hypokalemia  Anemia  Acute-on-chronic renal failure  Hypertension  Hyperlipemia  Discharge Diagnoses:  Principal Problem:  Acute respiratory failure with hypoxia  Active Problems:  Interstitial pulmonary fibrosis  Emphysema lung  Hypokalemia  Anemia, iron deficiency and blood loss  Dyspnea from anemia and emphysema  Acute-on-chronic renal failure  Hypertension  Hyperlipemia  Dehydration-improved  Possible Lupus  S/P Prostate cancer  S/P Low grade papillary urothelial carcinoa  Discharged Condition: fair  Hospital Course: 80 y.o. 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 left nephrectomy on 09/09/13 presented to the ED with complaints of worsening dyspnea. According to patient and spouse, no prior history of dyspnea although patient says that at times he might have had some dyspnea. Patient underwent renal biopsy on 1/14 and since then has been progressively getting weaker and less active. 3-4 days back, patient started noticing dyspnea on exertion. This was not associated with fever, chills, cough, wheezing, chest tightness or chest pain. Dyspnea got worse last night and ambulating even 50-100 steps to and for from his mailbox, made him significantly worse and he collapsed on a chair. In hospital his Hemoglobin was down to 7.8 and with 2 units blood transfusion it was up to 10-12  mg/dL. His ANA and anti-DNA were positive and Sjogrens A and B, Scleroderma and rheumatoid factor were negative.  He underwent cardiac cath which was difficult due to significant aortic tortuosity but with calcific coronary artery disease without significant stenosis. His renal function improved with hydration. He do not have significant pulmonary hypertension ( low wedge and PA pressures when he was dehydrated)  His emphysema will be treated with albuterol MDI as needed and other medications when he sees Jesse. Melvyn Owen on 08/18/2013.  He also has primary care follow up and nephrology follow up.  He will be seen by me in 1 week.  Consults: cardiology  Significant Diagnostic Studies: labs: Low Hgb of 7.8 to 12 mg/dL. Near normal WBC and platelets counts. Elevated glucose and BUN/Cr to normal glucose and BUN/Cr by discharge day.  EKG-Sinus tachycardia.  CXR-Bibasilar atelectasis.  CT of chest : Underlying emphysema with areas of interstitial fibrosis, progressed from 2010. Evidence of bibasilar edema; question a degree of underlying congestive heart failure superimposed on emphysematous change. Small area of infiltrate in the inferior lingula, either representing alveolar edema or possibly small focus of pneumonia.  No demonstrable pulmonary embolus.  Stable 2 mm nodular opacity on the right. Stability since 2010 is consistent with benign etiology.  Cardiac cath with low right heart pressures from dehydration and near normal coronaries.  Treatments: IV hydration, cardiac meds: Losartan and Simvastatin and respiratory therapy: albuterol/atropine nebulizer. Blood transfusion. Awaiting further pulmonary and Urology work-up/treatments.   08/24/2013 New pt eval/Jesse Owen re:  ? PF  Chief Complaint  Patient presents with  . HFU    Pt states that his breathing is much improved since hospital d/c,  but not quite back to his normal baseline. No new co's today   doe x across the room, now in wheelchair  No obvious  day  to day or daytime variabilty or assoc chronic cough or cp or chest tightness, subjective wheeze overt sinus or hb symptoms. No unusual exp hx or h/o childhood pna/ asthma or knowledge of premature birth.  Sleeping ok without nocturnal  or early am exacerbation  of respiratory  c/o's or need for noct saba. Also denies any obvious fluctuation of symptoms with weather or environmental changes or other aggravating or alleviating factors except as outlined above   Current Medications, Allergies, Complete Past Medical History, Past Surgical History, Family History, and Social History were reviewed in Jesse Owen record.              Review of Systems  Constitutional: Positive for unexpected weight change. Negative for fever, chills, activity change and appetite change.  HENT: Negative for congestion, dental problem, postnasal drip, rhinorrhea, sneezing, sore throat, trouble swallowing and voice change.   Eyes: Negative for visual disturbance.  Respiratory: Positive for shortness of breath. Negative for cough and choking.   Cardiovascular: Positive for leg swelling. Negative for chest pain.  Gastrointestinal: Negative for nausea, vomiting and abdominal pain.  Genitourinary: Negative for difficulty urinating.  Musculoskeletal: Positive for arthralgias.  Skin: Negative for rash.  Psychiatric/Behavioral: Negative for behavioral problems and confusion.       Objective:   Physical Exam    pleasant w/c wm nad  Wt Readings from Last 3 Encounters:  08/24/13 178 lb (80.74 kg)  08/11/13 172 lb 6.4 oz (78.2 kg)  08/11/13 172 lb 6.4 oz (78.2 kg)     HEENT: nl dentition, turbinates, and orophanx. Nl external ear canals without cough reflex   NECK :  without JVD/Nodes/TM/ nl carotid upstrokes bilaterally   LUNGS: no acc muscle use, clear to A and P bilaterally without cough on insp or exp maneuvers   CV:  RRR  no s3 or murmur or increase in P2, no edema - elastic hose    ABD:  soft and nontender with nl excursion in the supine position. No bruits or organomegaly, bowel sounds nl  MS:  warm without deformities, calf tenderness, cyanosis or clubbing  SKIN: warm and dry without lesions    NEURO:  alert, approp, no deficits    CXR  08/24/2013 :   Mediastinum and hilar structures are normal. Right base pulmonary infiltrate noted suggesting pneumonia. No pleural effusion or pneumothorax. Heart size and pulmonary vascularity normal. Degenerative changes thoracic spine. No acute osseous abnormality.  Lab Results  Component Value Date   ESRSEDRATE 80* 08/24/2013   Lab Results  Component Value Date   PROBNP 60.0 08/24/2013      . Assessment:

## 2013-08-24 NOTE — Patient Instructions (Addendum)
Please remember to go to the lab and x-ray department downstairs for your tests - we will call you with the results when they are available.  Please schedule a follow up office visit in 4 weeks, sooner if needed with pfts on return Late add : referred to rheum

## 2013-08-24 NOTE — Assessment & Plan Note (Signed)
-  08/24/2013   Walked RA x one lap @ 185 stopped due to fatgiue, no sob, no desat  ESR >> BNP strongly suggests active lung inflammation ? Residual from ALI or some form of collagen vasc dz > see PF

## 2013-09-09 ENCOUNTER — Ambulatory Visit (HOSPITAL_COMMUNITY): Admission: RE | Admit: 2013-09-09 | Payer: Medicare Other | Source: Ambulatory Visit | Admitting: Urology

## 2013-09-09 ENCOUNTER — Encounter (HOSPITAL_COMMUNITY): Admission: RE | Payer: Self-pay | Source: Ambulatory Visit

## 2013-09-09 SURGERY — ROBOT ASSITED LAPAROSCOPIC NEPHROURETERECTOMY
Anesthesia: General | Laterality: Left

## 2013-09-17 ENCOUNTER — Other Ambulatory Visit: Payer: Self-pay | Admitting: Family Medicine

## 2013-09-17 DIAGNOSIS — R531 Weakness: Secondary | ICD-10-CM

## 2013-09-18 ENCOUNTER — Other Ambulatory Visit: Payer: Self-pay | Admitting: Family Medicine

## 2013-09-18 ENCOUNTER — Ambulatory Visit
Admission: RE | Admit: 2013-09-18 | Discharge: 2013-09-18 | Disposition: A | Discharge status: Home / Self Care | Payer: Medicare Other | Source: Ambulatory Visit | Attending: Family Medicine | Admitting: Family Medicine

## 2013-09-18 DIAGNOSIS — R531 Weakness: Secondary | ICD-10-CM

## 2013-09-18 MED ORDER — GADOBENATE DIMEGLUMINE 529 MG/ML IV SOLN
16.0000 mL | Freq: Once | INTRAVENOUS | Status: AC | PRN
  Administered 2013-09-18: 16 mL via INTRAVENOUS

## 2013-09-19 ENCOUNTER — Emergency Department (HOSPITAL_COMMUNITY): Payer: Medicare Other

## 2013-09-19 ENCOUNTER — Encounter (HOSPITAL_COMMUNITY): Payer: Self-pay | Admitting: Emergency Medicine

## 2013-09-19 ENCOUNTER — Inpatient Hospital Stay (HOSPITAL_COMMUNITY)
Admission: EM | Admit: 2013-09-19 | Discharge: 2013-09-23 | DRG: 055 | Disposition: A | Discharge status: Hospice | Payer: Medicare Other | Attending: Internal Medicine | Admitting: Internal Medicine

## 2013-09-19 DIAGNOSIS — N189 Chronic kidney disease, unspecified: Secondary | ICD-10-CM

## 2013-09-19 DIAGNOSIS — G9389 Other specified disorders of brain: Secondary | ICD-10-CM

## 2013-09-19 DIAGNOSIS — Z79899 Other long term (current) drug therapy: Secondary | ICD-10-CM

## 2013-09-19 DIAGNOSIS — E785 Hyperlipidemia, unspecified: Secondary | ICD-10-CM | POA: Diagnosis present

## 2013-09-19 DIAGNOSIS — W19XXXA Unspecified fall, initial encounter: Secondary | ICD-10-CM | POA: Diagnosis present

## 2013-09-19 DIAGNOSIS — Z8673 Personal history of transient ischemic attack (TIA), and cerebral infarction without residual deficits: Secondary | ICD-10-CM

## 2013-09-19 DIAGNOSIS — C719 Malignant neoplasm of brain, unspecified: Principal | ICD-10-CM | POA: Diagnosis present

## 2013-09-19 DIAGNOSIS — C7949 Secondary malignant neoplasm of other parts of nervous system: Secondary | ICD-10-CM

## 2013-09-19 DIAGNOSIS — R000 Tachycardia, unspecified: Secondary | ICD-10-CM | POA: Diagnosis present

## 2013-09-19 DIAGNOSIS — Z923 Personal history of irradiation: Secondary | ICD-10-CM

## 2013-09-19 DIAGNOSIS — J439 Emphysema, unspecified: Secondary | ICD-10-CM

## 2013-09-19 DIAGNOSIS — I10 Essential (primary) hypertension: Secondary | ICD-10-CM | POA: Diagnosis present

## 2013-09-19 DIAGNOSIS — K5900 Constipation, unspecified: Secondary | ICD-10-CM | POA: Diagnosis present

## 2013-09-19 DIAGNOSIS — Z66 Do not resuscitate: Secondary | ICD-10-CM | POA: Diagnosis present

## 2013-09-19 DIAGNOSIS — Z9181 History of falling: Secondary | ICD-10-CM

## 2013-09-19 DIAGNOSIS — E876 Hypokalemia: Secondary | ICD-10-CM

## 2013-09-19 DIAGNOSIS — J9601 Acute respiratory failure with hypoxia: Secondary | ICD-10-CM

## 2013-09-19 DIAGNOSIS — Z8546 Personal history of malignant neoplasm of prostate: Secondary | ICD-10-CM

## 2013-09-19 DIAGNOSIS — G939 Disorder of brain, unspecified: Secondary | ICD-10-CM

## 2013-09-19 DIAGNOSIS — G8190 Hemiplegia, unspecified affecting unspecified side: Secondary | ICD-10-CM | POA: Diagnosis present

## 2013-09-19 DIAGNOSIS — C7931 Secondary malignant neoplasm of brain: Secondary | ICD-10-CM | POA: Diagnosis present

## 2013-09-19 DIAGNOSIS — D649 Anemia, unspecified: Secondary | ICD-10-CM | POA: Diagnosis present

## 2013-09-19 DIAGNOSIS — R29898 Other symptoms and signs involving the musculoskeletal system: Secondary | ICD-10-CM

## 2013-09-19 DIAGNOSIS — J8410 Pulmonary fibrosis, unspecified: Secondary | ICD-10-CM

## 2013-09-19 DIAGNOSIS — Z8042 Family history of malignant neoplasm of prostate: Secondary | ICD-10-CM

## 2013-09-19 DIAGNOSIS — Z85820 Personal history of malignant melanoma of skin: Secondary | ICD-10-CM

## 2013-09-19 DIAGNOSIS — N179 Acute kidney failure, unspecified: Secondary | ICD-10-CM

## 2013-09-19 DIAGNOSIS — R0600 Dyspnea, unspecified: Secondary | ICD-10-CM

## 2013-09-19 HISTORY — DX: Neoplasm of unspecified behavior of brain: D49.6

## 2013-09-19 LAB — URINALYSIS, ROUTINE W REFLEX MICROSCOPIC
Bilirubin Urine: NEGATIVE
GLUCOSE, UA: NEGATIVE mg/dL
Ketones, ur: NEGATIVE mg/dL
LEUKOCYTES UA: NEGATIVE
Nitrite: NEGATIVE
PH: 6 (ref 5.0–8.0)
Protein, ur: >300 mg/dL — AB
Specific Gravity, Urine: 1.011 (ref 1.005–1.030)
Urobilinogen, UA: 0.2 mg/dL (ref 0.0–1.0)

## 2013-09-19 LAB — COMPREHENSIVE METABOLIC PANEL
ALT: 14 U/L (ref 0–53)
AST: 20 U/L (ref 0–37)
Albumin: 2.7 g/dL — ABNORMAL LOW (ref 3.5–5.2)
Alkaline Phosphatase: 67 U/L (ref 39–117)
BUN: 17 mg/dL (ref 6–23)
CHLORIDE: 103 meq/L (ref 96–112)
CO2: 24 mEq/L (ref 19–32)
Calcium: 9.8 mg/dL (ref 8.4–10.5)
Creatinine, Ser: 1.05 mg/dL (ref 0.50–1.35)
GFR calc Af Amer: 75 mL/min — ABNORMAL LOW (ref >90)
GFR calc non Af Amer: 64 mL/min — ABNORMAL LOW (ref >90)
Glucose, Bld: 127 mg/dL — ABNORMAL HIGH (ref 70–99)
Potassium: 3.9 mEq/L (ref 3.7–5.3)
SODIUM: 141 meq/L (ref 137–147)
TOTAL PROTEIN: 6.3 g/dL (ref 6.0–8.3)

## 2013-09-19 LAB — CBC WITH DIFFERENTIAL/PLATELET
Basophils Absolute: 0.1 10*3/uL (ref 0.0–0.1)
Basophils Relative: 1 % (ref 0–1)
Eosinophils Absolute: 0.9 10*3/uL — ABNORMAL HIGH (ref 0.0–0.7)
Eosinophils Relative: 9 % — ABNORMAL HIGH (ref 0–5)
HCT: 36.2 % — ABNORMAL LOW (ref 39.0–52.0)
Hemoglobin: 12.3 g/dL — ABNORMAL LOW (ref 13.0–17.0)
Lymphocytes Relative: 16 % (ref 12–46)
Lymphs Abs: 1.5 10*3/uL (ref 0.7–4.0)
MCH: 31.2 pg (ref 26.0–34.0)
MCHC: 34 g/dL (ref 30.0–36.0)
MCV: 91.9 fL (ref 78.0–100.0)
Monocytes Absolute: 1 10*3/uL (ref 0.1–1.0)
Monocytes Relative: 11 % (ref 3–12)
NEUTROS ABS: 6.1 10*3/uL (ref 1.7–7.7)
NEUTROS PCT: 64 % (ref 43–77)
PLATELETS: 317 10*3/uL (ref 150–400)
RBC: 3.94 MIL/uL — AB (ref 4.22–5.81)
RDW: 14.4 % (ref 11.5–15.5)
WBC: 9.6 10*3/uL (ref 4.0–10.5)

## 2013-09-19 LAB — URINE MICROSCOPIC-ADD ON

## 2013-09-19 MED ORDER — ACETAMINOPHEN 325 MG PO TABS
650.0000 mg | ORAL_TABLET | Freq: Four times a day (QID) | ORAL | Status: DC | PRN
  Administered 2013-09-20 (×2): 650 mg via ORAL
  Filled 2013-09-19 (×2): qty 2

## 2013-09-19 MED ORDER — SODIUM CHLORIDE 0.9 % IV SOLN: INTRAVENOUS | Status: DC | PRN

## 2013-09-19 MED ORDER — ONDANSETRON HCL 4 MG PO TABS
4.0000 mg | ORAL_TABLET | Freq: Four times a day (QID) | ORAL | Status: DC | PRN
  Administered 2013-09-20 (×2): 4 mg via ORAL
  Filled 2013-09-19 (×2): qty 1

## 2013-09-19 MED ORDER — LOSARTAN POTASSIUM 50 MG PO TABS
50.0000 mg | ORAL_TABLET | Freq: Every day | ORAL | Status: DC
  Administered 2013-09-20 – 2013-09-23 (×4): 50 mg via ORAL
  Filled 2013-09-19 (×5): qty 1

## 2013-09-19 MED ORDER — DEXAMETHASONE SODIUM PHOSPHATE 4 MG/ML IJ SOLN
4.0000 mg | Freq: Four times a day (QID) | INTRAMUSCULAR | Status: DC
  Administered 2013-09-19 – 2013-09-23 (×15): 4 mg via INTRAVENOUS
  Filled 2013-09-19 (×19): qty 1

## 2013-09-19 MED ORDER — SODIUM CHLORIDE 0.9 % IJ SOLN
3.0000 mL | Freq: Two times a day (BID) | INTRAMUSCULAR | Status: DC
  Administered 2013-09-19 – 2013-09-23 (×8): 3 mL via INTRAVENOUS

## 2013-09-19 MED ORDER — ALBUTEROL SULFATE (2.5 MG/3ML) 0.083% IN NEBU: 3.0000 mL | INHALATION_SOLUTION | Freq: Four times a day (QID) | RESPIRATORY_TRACT | Status: DC | PRN

## 2013-09-19 MED ORDER — DEXAMETHASONE SODIUM PHOSPHATE 10 MG/ML IJ SOLN
10.0000 mg | Freq: Once | INTRAMUSCULAR | Status: AC
  Administered 2013-09-19: 10 mg via INTRAVENOUS
  Filled 2013-09-19: qty 1

## 2013-09-19 MED ORDER — ONDANSETRON HCL 4 MG/2ML IJ SOLN: 4.0000 mg | Freq: Four times a day (QID) | INTRAMUSCULAR | Status: DC | PRN

## 2013-09-19 MED ORDER — FINASTERIDE 5 MG PO TABS
5.0000 mg | ORAL_TABLET | Freq: Every morning | ORAL | Status: DC
  Administered 2013-09-20 – 2013-09-23 (×4): 5 mg via ORAL
  Filled 2013-09-19 (×4): qty 1

## 2013-09-19 MED ORDER — ACETAMINOPHEN 650 MG RE SUPP: 650.0000 mg | Freq: Four times a day (QID) | RECTAL | Status: DC | PRN

## 2013-09-19 MED ORDER — ALBUTEROL SULFATE (2.5 MG/3ML) 0.083% IN NEBU
3.0000 mL | INHALATION_SOLUTION | RESPIRATORY_TRACT | Status: DC
  Administered 2013-09-19: 3 mL via RESPIRATORY_TRACT
  Filled 2013-09-19: qty 3

## 2013-09-19 MED ORDER — PANTOPRAZOLE SODIUM 40 MG PO TBEC
40.0000 mg | DELAYED_RELEASE_TABLET | Freq: Every day | ORAL | Status: DC
  Administered 2013-09-20 – 2013-09-21 (×2): 40 mg via ORAL
  Filled 2013-09-19 (×3): qty 1

## 2013-09-19 MED ORDER — FERROUS SULFATE 325 (65 FE) MG PO TABS
325.0000 mg | ORAL_TABLET | Freq: Every day | ORAL | Status: DC
  Administered 2013-09-20 – 2013-09-23 (×4): 325 mg via ORAL
  Filled 2013-09-19 (×5): qty 1

## 2013-09-19 MED ORDER — SENNOSIDES-DOCUSATE SODIUM 8.6-50 MG PO TABS
1.0000 | ORAL_TABLET | Freq: Every day | ORAL | Status: DC
  Administered 2013-09-19 – 2013-09-20 (×2): 1 via ORAL
  Filled 2013-09-19 (×3): qty 1

## 2013-09-19 MED ORDER — LORATADINE 10 MG PO TABS
10.0000 mg | ORAL_TABLET | Freq: Every morning | ORAL | Status: DC
  Administered 2013-09-20 – 2013-09-23 (×4): 10 mg via ORAL
  Filled 2013-09-19 (×4): qty 1

## 2013-09-19 NOTE — ED Notes (Addendum)
Pt from home via [facility] c/o of increased weakness and involuntary right arm movement x 2 weeks. Pt had an MRI yesterday in which a brain tumor was found. Pt had two falls in the past 24 hours d/t "legs giving out" but denies injury or pain. Pt alert and oriented per baseline.

## 2013-09-19 NOTE — ED Provider Notes (Signed)
CSN: [id number]     Arrival date & time 09/19/13  1419 History   First MD Initiated Contact with Patient 09/19/13 1456     Chief Complaint  Patient presents with  . Weakness     (Consider location/radiation/quality/duration/timing/severity/associated sxs/prior Treatment) Patient is a 80 y.o. male presenting with weakness. The history is provided by the patient.  Weakness This is a new problem. The current episode started 3 to 5 hours ago (11:30 AM). The problem occurs constantly. The problem has not changed since onset.Pertinent negatives include no chest pain, no abdominal pain, no headaches and no shortness of breath. Nothing aggravates the symptoms. Nothing relieves the symptoms. He has tried nothing for the symptoms. The treatment provided no relief.    Past Medical History  Diagnosis Date  . Hypertension   . Hyperlipemia   . TIA (transient ischemic attack)   . Hearing loss     left ear   . Prostate cancer     tx. radiation- greater than 10 yrs  . H/O seasonal allergies     mild hayfever  . Cataracts, bilateral   . UTI (urinary tract infection) 07-13-13    at present to start antibiotic,not yet started or picked up from pharmacy  . Hematuria 07/15/2013  . Brain tumor    Past Surgical History  Procedure Laterality Date  . Melanoma excision    . Cystoscopy with retrograde pyelogram, ureteroscopy and stent placement Left 07/15/2013    Procedure: CYSTOSCOPY WITH BILATERAL RETROGRADE PYELOGRAM, URETEROSCOPY AND LEFT URETERA LDILATION STENT PLACEMENT AND  RENAL MASS BIOPSY;  Surgeon: Molli Hazard, MD;  Location: [facility];  Service: Urology;  Laterality: Left;   Family History  Problem Relation Age of Onset  . Prostate cancer Father     with mets to liver   History  Substance Use Topics  . Smoking status: Never Smoker   . Smokeless tobacco: Never Used  . Alcohol Use: No    Review of Systems  Constitutional: Negative for fever.  HENT: Negative for drooling and  rhinorrhea.   Eyes: Negative for pain.  Respiratory: Negative for cough and shortness of breath.   Cardiovascular: Negative for chest pain and leg swelling.  Gastrointestinal: Negative for nausea, vomiting, abdominal pain and diarrhea.  Genitourinary: Negative for dysuria and hematuria.  Musculoskeletal: Negative for gait problem and neck pain.  Skin: Negative for color change.  Neurological: Positive for weakness. Negative for numbness and headaches.  Hematological: Negative for adenopathy.  Psychiatric/Behavioral: Negative for behavioral problems.  All other systems reviewed and are negative.      Allergies  Review of patient's allergies indicates no known allergies.  Home Medications   Current Outpatient Rx  Name  Route  Sig  Dispense  Refill  . albuterol (PROVENTIL HFA;VENTOLIN HFA) 108 (90 BASE) MCG/ACT inhaler   Inhalation   Inhale 2 puffs into the lungs every 4 (four) hours.   1 Inhaler   3   . Calcium Carb-Cholecalciferol (CALCIUM 600 + D PO)   Oral   Take 1 tablet by mouth 2 (two) times daily.         . ferrous sulfate 325 (65 FE) MG tablet   Oral   Take 1 tablet (325 mg total) by mouth daily with breakfast.   30 tablet   3   . finasteride (PROSCAR) 5 MG tablet   Oral   Take 5 mg by mouth every morning.          . flunisolide (NASAREL) 29  MCG/ACT (0.025%) nasal spray   Nasal   Place 2 sprays into the nose daily as needed for rhinitis or allergies. Dose is for each nostril.         . hydrocortisone cream 1 %   Topical   Apply 1 application topically 2 (two) times daily as needed (Applies to back for itching.).         Marland Kitchen loratadine (CLARITIN) 10 MG tablet   Oral   Take 10 mg by mouth every morning.          Marland Kitchen losartan (COZAAR) 100 MG tablet   Oral   Take 0.5 tablets (50 mg total) by mouth daily with breakfast.         . Misc Natural Products (OSTEO BI-FLEX JOINT SHIELD PO)   Oral   Take 1 tablet by mouth 2 (two) times daily.           . Multiple Vitamin (MULTIVITAMIN WITH MINERALS) TABS tablet   Oral   Take 1 tablet by mouth every morning.          . polyvinyl alcohol (LIQUIFILM TEARS) 1.4 % ophthalmic solution   Both Eyes   Place 2 drops into both eyes daily as needed for dry eyes.         . potassium gluconate 595 MG TABS tablet   Oral   Take 595 mg by mouth every morning.          . sennosides-docusate sodium (SENOKOT-S) 8.6-50 MG tablet   Oral   Take 1 tablet by mouth at bedtime.         . simvastatin (ZOCOR) 40 MG tablet   Oral   Take 20 mg by mouth at bedtime.          BP 169/144  Pulse 100  Temp(Src) 97.8 F (36.6 C) (Oral)  Resp 16  SpO2 97% Physical Exam  Nursing note and vitals reviewed. Constitutional: He is oriented to person, place, and time. He appears well-developed and well-nourished.  HENT:  Head: Normocephalic and atraumatic.  Right Ear: External ear normal.  Left Ear: External ear normal.  Nose: Nose normal.  Mouth/Throat: Oropharynx is clear and moist. No oropharyngeal exudate.  Eyes: Conjunctivae and EOM are normal. Pupils are equal, round, and reactive to light.  Neck: Normal range of motion. Neck supple.  Cardiovascular: Regular rhythm, normal heart sounds and intact distal pulses.  Exam reveals no gallop and no friction rub.   No murmur heard. Hr 102  Pulmonary/Chest: Effort normal and breath sounds normal. No respiratory distress. He has no wheezes.  Abdominal: Soft. Bowel sounds are normal. He exhibits no distension. There is no tenderness. There is no rebound and no guarding.  Musculoskeletal: Normal range of motion. He exhibits edema (mild non pitting in bilateral LE's). He exhibits no tenderness.  Neurological: He is alert and oriented to person, place, and time.  Reflex Scores:      Tricep reflexes are 2+ on the right side and 2+ on the left side.      Bicep reflexes are 2+ on the right side and 2+ on the left side.      Brachioradialis reflexes are 2+ on  the right side and 2+ on the left side.      Patellar reflexes are 2+ on the right side and 2+ on the left side.      Achilles reflexes are 2+ on the right side and 2+ on the left side. alert, oriented x3 speech: normal in  context and clarity memory: intact grossly cranial nerves II-XII: intact motor strength: mild weakness noted in RUE and grip strength in right hand, otherwise normal strength, mild pronator drift in RUE and RLE sensation: intact to light touch diffusely  cerebellar: difficulty w/ finger to nose and heel to shin in the RUE and RLE gait: shuffling gait, appears to drag the right leg slightly   Skin: Skin is warm and dry.  Psychiatric: He has a normal mood and affect. His behavior is normal.    ED Course  Procedures (including critical care time) Labs Review Labs Reviewed  CBC WITH DIFFERENTIAL - Abnormal; Notable for the following:    RBC 3.94 (*)    Hemoglobin 12.3 (*)    HCT 36.2 (*)    Eosinophils Relative 9 (*)    Eosinophils Absolute 0.9 (*)    All other components within normal limits  COMPREHENSIVE METABOLIC PANEL - Abnormal; Notable for the following:    Glucose, Bld 127 (*)    Albumin 2.7 (*)    Total Bilirubin <0.2 (*)    GFR calc non Af Amer 64 (*)    GFR calc Af Amer 75 (*)    All other components within normal limits  URINALYSIS, ROUTINE W REFLEX MICROSCOPIC - Abnormal; Notable for the following:    Hgb urine dipstick TRACE (*)    Protein, ur >300 (*)    All other components within normal limits  URINE MICROSCOPIC-ADD ON  BASIC METABOLIC PANEL  CBC   Imaging Review Mr Kizzie Fantasia Contrast  09/18/2013   CLINICAL DATA:  80 year old male with right side weakness x2 months. Possible stroke. Initial encounter.  EXAM: MRI HEAD WITHOUT AND WITH CONTRAST  TECHNIQUE: Multiplanar, multiecho pulse sequences of the brain and surrounding structures were obtained without and with intravenous contrast.  CONTRAST:  71mL MULTIHANCE GADOBENATE DIMEGLUMINE 529  MG/ML IV SOLN  COMPARISON:  None.  FINDINGS: There is a large T2 heterogeneous enhancing mass in the posterior left hemisphere, epicenter air at the confluence of the posterior left frontal and temporal lobes. This appears to be intra-axial. It has thick nodular and peripheral enhancement. The confluent enhancing component of the mass encompasses 52 x 51 x 59 mm (AP by transverse by CC), and the mass extends peripherally to abut and probably involve the lateral dura (series 13, image 14). There are thick curvilinear regions of restricted diffusion within the mass compatible with hypercellularity in this setting (series 400, image 22). There is surrounding T2 and FLAIR hyperintensity extensively involving the left parietal lobe and tracking to the operculum. T2* images demonstrate a mineralized or hemorrhagic component centrally (series 6, image 15).  There is a small separate satellite enhancing nodule at the inferior frontal operculum (series 13, image 17 and series 12, image 38).  Abnormal T2 and FLAIR signal extends inferiorly to the posterior limb of the external capsule and also toward the midline at the level of the posterior body of the corpus callosum, but no extension across midline is identified (series 11, image 15).  No contralateral or distant enhancing lesion. Superimposed patchy bilateral cerebral white matter T2 and FLAIR hyperintensity. Major intracranial vascular flow voids are preserved. No acute infarct. Mass effect on the left lateral ventricle with no ventriculomegaly. No impending herniation. Basilar cisterns are patent. Negative pituitary, cervicomedullary junction visualized cervical spine. Normal bone marrow signal. Visualized orbit soft tissues are within normal limits. Visualized paranasal sinuses and mastoids are clear.  IMPRESSION: 1. Large posterior left hemisphere enhancing mass  most compatible with glioblastoma. CNS lymphoma or grade 3 glioma are the main differential considerations.  Solitary brain metastasis is unlikely. 2. Findings include abnormal T2 signal tracking to the left external capsule and toward but not across the corpus callosum. Small enhancing 8 mm satellite lesion just anterior to the dominant mass. 3. Mass effect but no impending herniation.  No ventriculomegaly. Recommend neurosurgery followup. Study discussed by telephone with Dr. Nancy Fetter (covering for Dr. Maury Dus )on 09/18/2013 at 1402 hours .   Electronically Signed   By: Lars Pinks M.D.   On: 09/18/2013 14:08     EKG Interpretation   Date/Time:  Saturday September 19 2013 16:02:23 EDT Ventricular Rate:  104 PR Interval:  209 QRS Duration: 81 QT Interval:  348 QTC Calculation: 458 R Axis:   -7 Text Interpretation:  Sinus tachycardia Borderline prolonged PR interval  Abnormal R-wave progression, early transition Confirmed by Jayin Derousse  MD,  Dennys Guin ([id number]) on 09/19/2013 4:07:56 PM      MDM   Final diagnoses:  Brain mass  Fall  Left leg weakness    3:28 PM 80 y.o. male with a complicated medical history who presents with falling. The patient notes that he began having riding movements and jerking of his right arm 3 weeks ago. He had an MRI yesterday which showed a brain tumor. He was notified by Dr. that he would be set up with a neurosurgeon on Monday and to come to the emergency room if he has any worsening of his symptoms. The wife notes that he had a mechanical fall yesterday and then this morning as well. He did not hit his head or was conscious during these events. He states that he is now having some difficulty coordinating movement in his right leg which is a new finding since yesterday. This was first noticed at approx 11:30 AM this morning.  She states that he is otherwise been well and denies any fevers, vomiting, diarrhea, or cough. He is afebrile, mildly tachycardic, mildly hypertensive here. Will get screening labwork and repeat imaging of the head do to recent falls.  6:42 PM Seen by [facility] in  the ED. Please see his note.   The patient will be admitted to [facility]. Any medications given in the ED during this visit are listed below:  Medications  dexamethasone (DECADRON) injection 10 mg (10 mg Intravenous Given 09/19/13 1831)      Blanchard Kelch, MD 09/19/13 2326

## 2013-09-19 NOTE — ED Notes (Signed)
Attempted to call report to [facility]--nurse unable to take at this time Call back number given

## 2013-09-19 NOTE — ED Notes (Signed)
Bed: EY22 Expected date: 09/19/13 Expected time: 2:07 PM Means of arrival: Ambulance Comments: Neuro changes

## 2013-09-19 NOTE — H&P (Signed)
[facility] History and Physical  Jesse Owen WFU:[id number] DOB: [birth year] DOA: 09/19/2013  Referring physician: EDP PCP: Vena Austria, MD   Chief Complaint: left leg weakness since few weeks.   HPI: Jesse Owen is a 80 y.o. male with prior h/o brain cancer, prostate cancer with mets was brought in to the hospital for recurrent falls. He was seen yesterday and underwent MRI brain showed a parietal lobe mass with hemorrhage, probably a glioblastoma. He was sent home. He was brought in for multiple falls since then. He also reports left sided weakness for 2 months. He denies headache, nausea or vomiting , fever or c hills. On arrival he underwent a repeat CT head did not reveal any new changes. He was seen by a neuro surgeon recommended non surgical intervention, like decadron, palliative radiation and hospice care. He is then referred tohospitalist service for management of the above.   Review of Systems:  Pt appears confused. Nobody at bedside,  Limitedhistory from the the patient.   Past Medical History  Diagnosis Date  . Hypertension   . Hyperlipemia   . TIA (transient ischemic attack)   . Hearing loss     left ear   . Prostate cancer     tx. radiation- greater than 10 yrs  . H/O seasonal allergies     mild hayfever  . Cataracts, bilateral   . UTI (urinary tract infection) 07-13-13    at present to start antibiotic,not yet started or picked up from pharmacy  . Hematuria 07/15/2013  . Brain tumor    Past Surgical History  Procedure Laterality Date  . Melanoma excision    . Cystoscopy with retrograde pyelogram, ureteroscopy and stent placement Left 07/15/2013    Procedure: CYSTOSCOPY WITH BILATERAL RETROGRADE PYELOGRAM, URETEROSCOPY AND LEFT URETERA LDILATION STENT PLACEMENT AND  RENAL MASS BIOPSY;  Surgeon: Molli Hazard, MD;  Location: [facility];  Service: Urology;  Laterality: Left;   Social History:  reports that he has never smoked. He  has never used smokeless tobacco. He reports that he does not drink alcohol or use illicit drugs.  No Known Allergies  Family History  Problem Relation Age of Onset  . Prostate cancer Father     with mets to liver     Prior to Admission medications   Medication Sig Start Date End Date Taking? Authorizing Provider  albuterol (PROVENTIL HFA;VENTOLIN HFA) 108 (90 BASE) MCG/ACT inhaler Inhale 2 puffs into the lungs every 4 (four) hours. 08/11/13  Yes Birdie Riddle, MD  Calcium Carb-Cholecalciferol (CALCIUM 600 + D PO) Take 1 tablet by mouth 2 (two) times daily.   Yes Historical Provider, MD  ferrous sulfate 325 (65 FE) MG tablet Take 1 tablet (325 mg total) by mouth daily with breakfast. 08/12/13  Yes Birdie Riddle, MD  finasteride (PROSCAR) 5 MG tablet Take 5 mg by mouth every morning.    Yes Historical Provider, MD  flunisolide (NASAREL) 29 MCG/ACT (0.025%) nasal spray Place 2 sprays into the nose daily as needed for rhinitis or allergies. Dose is for each nostril.   Yes Historical Provider, MD  hydrocortisone cream 1 % Apply 1 application topically 2 (two) times daily as needed (Applies to back for itching.).   Yes Historical Provider, MD  loratadine (CLARITIN) 10 MG tablet Take 10 mg by mouth every morning.    Yes Historical Provider, MD  losartan (COZAAR) 100 MG tablet Take 0.5 tablets (50 mg total) by mouth daily with breakfast. 08/11/13  Yes Birdie Riddle, MD  Misc Natural Products (OSTEO BI-FLEX JOINT SHIELD PO) Take 1 tablet by mouth 2 (two) times daily.    Yes Historical Provider, MD  Multiple Vitamin (MULTIVITAMIN WITH MINERALS) TABS tablet Take 1 tablet by mouth every morning.    Yes Historical Provider, MD  polyvinyl alcohol (LIQUIFILM TEARS) 1.4 % ophthalmic solution Place 2 drops into both eyes daily as needed for dry eyes.   Yes Historical Provider, MD  potassium gluconate 595 MG TABS tablet Take 595 mg by mouth every morning.    Yes Historical Provider, MD  sennosides-docusate  sodium (SENOKOT-S) 8.6-50 MG tablet Take 1 tablet by mouth at bedtime.   Yes Historical Provider, MD  simvastatin (ZOCOR) 40 MG tablet Take 20 mg by mouth at bedtime.   Yes Historical Provider, MD   Physical Exam: Filed Vitals:   09/19/13 1716  BP: 147/92  Pulse: 99  Temp: 98.6 F (37 C)  Resp: 15    BP 147/92  Pulse 99  Temp(Src) 98.6 F (37 C) (Oral)  Resp 15  SpO2 98%  General:  Appears calm and comfortable Eyes: PERRL, normal lids, irises & conjunctiva ENT: grossly normal hearing, lips & tongue Neck: no LAD, masses or thyromegaly Cardiovascular: RRR, no m/r/g. No LE edema. Respiratory: CTA bilaterally, no w/r/r. Normal respiratory effort. Abdomen: soft, ntnd Skin: no rash or induration seen on limited exam Musculoskeletal: grossly normal tone BUE/BLE Psychiatric: grossly normal mood and affect, speech fluent and appropriate Neurologic: left upper and lower extremity strength is 4/5. Speech normal, no facial droop.           Labs on Admission:  Basic Metabolic Panel:  Recent Labs Lab 09/19/13 1554  NA 141  K 3.9  CL 103  CO2 24  GLUCOSE 127*  BUN 17  CREATININE 1.05  CALCIUM 9.8   Liver Function Tests:  Recent Labs Lab 09/19/13 1554  AST 20  ALT 14  ALKPHOS 67  BILITOT <0.2*  PROT 6.3  ALBUMIN 2.7*   No results found for this basename: LIPASE, AMYLASE,  in the last 168 hours No results found for this basename: AMMONIA,  in the last 168 hours CBC:  Recent Labs Lab 09/19/13 1554  WBC 9.6  NEUTROABS 6.1  HGB 12.3*  HCT 36.2*  MCV 91.9  PLT 317   Cardiac Enzymes: No results found for this basename: CKTOTAL, CKMB, CKMBINDEX, TROPONINI,  in the last 168 hours  BNP (last 3 results)  Recent Labs  08/05/13 1320 08/24/13 1019  PROBNP 261.9 60.0   CBG: No results found for this basename: GLUCAP,  in the last 168 hours  Radiological Exams on Admission: Dg Chest 2 View  09/19/2013   CLINICAL DATA:  Weakness for 3 months, hypertension,  prostate cancer  EXAM: CHEST  2 VIEW  COMPARISON:  08/24/2013  FINDINGS: Normal heart size, mediastinal contours, and pulmonary vascularity.  Mild tortuous thoracic aorta.  Lungs clear.  Slightly prominent first costochondral junctions unchanged.  No pleural effusion or pneumothorax.  No acute osseous findings.  IMPRESSION: No acute abnormalities.   Electronically Signed   By: Lavonia Dana M.D.   On: 09/19/2013 15:58   Ct Head Wo Contrast  09/19/2013   CLINICAL DATA:  Involuntary right arm movement for 2 weeks  EXAM: CT HEAD WITHOUT CONTRAST  TECHNIQUE: Contiguous axial images were obtained from the base of the skull through the vertex without intravenous contrast.  COMPARISON:  MR HEAD WO/W CM dated 09/18/2013  FINDINGS: Again  noted is an ill defined left posterior parietal lobe mass with severe surrounding vasogenic edema. There is mild left-to-right midline shift measuring 7 mm. There is a 10 mm hyperdense area within the left parietal lobe mass likely representing a small area of hemorrhage. There is no extra-axial hemorrhage. There is no evidence of a cortical-based area of acute infarction. There is periventricular white matter low attenuation likely secondary to microangiopathy.  There is no hydrocephalus.  The basal cisterns are patent.  Visualized portions of the orbits are unremarkable. The visualized portions of the paranasal sinuses and mastoid air cells are unremarkable.  The osseous structures are unremarkable.  IMPRESSION: 1.  No new findings compared with 09/18/2013.  2. Large left parietal lobe mass with a small central area of hyperdensity suggesting hemorrhage as was previously demonstrated on the MRI dated 09/18/2013. There is severe surrounding vasogenic edema.   Electronically Signed   By: Kathreen Devoid   On: 09/19/2013 16:31   Mr Jeri Cos Wo Contrast  09/18/2013   CLINICAL DATA:  80 year old male with right side weakness x2 months. Possible stroke. Initial encounter.  EXAM: MRI HEAD WITHOUT  AND WITH CONTRAST  TECHNIQUE: Multiplanar, multiecho pulse sequences of the brain and surrounding structures were obtained without and with intravenous contrast.  CONTRAST:  43mL MULTIHANCE GADOBENATE DIMEGLUMINE 529 MG/ML IV SOLN  COMPARISON:  None.  FINDINGS: There is a large T2 heterogeneous enhancing mass in the posterior left hemisphere, epicenter air at the confluence of the posterior left frontal and temporal lobes. This appears to be intra-axial. It has thick nodular and peripheral enhancement. The confluent enhancing component of the mass encompasses 52 x 51 x 59 mm (AP by transverse by CC), and the mass extends peripherally to abut and probably involve the lateral dura (series 13, image 14). There are thick curvilinear regions of restricted diffusion within the mass compatible with hypercellularity in this setting (series 400, image 22). There is surrounding T2 and FLAIR hyperintensity extensively involving the left parietal lobe and tracking to the operculum. T2* images demonstrate a mineralized or hemorrhagic component centrally (series 6, image 15).  There is a small separate satellite enhancing nodule at the inferior frontal operculum (series 13, image 17 and series 12, image 38).  Abnormal T2 and FLAIR signal extends inferiorly to the posterior limb of the external capsule and also toward the midline at the level of the posterior body of the corpus callosum, but no extension across midline is identified (series 11, image 15).  No contralateral or distant enhancing lesion. Superimposed patchy bilateral cerebral white matter T2 and FLAIR hyperintensity. Major intracranial vascular flow voids are preserved. No acute infarct. Mass effect on the left lateral ventricle with no ventriculomegaly. No impending herniation. Basilar cisterns are patent. Negative pituitary, cervicomedullary junction visualized cervical spine. Normal bone marrow signal. Visualized orbit soft tissues are within normal limits.  Visualized paranasal sinuses and mastoids are clear.  IMPRESSION: 1. Large posterior left hemisphere enhancing mass most compatible with glioblastoma. CNS lymphoma or grade 3 glioma are the main differential considerations. Solitary brain metastasis is unlikely. 2. Findings include abnormal T2 signal tracking to the left external capsule and toward but not across the corpus callosum. Small enhancing 8 mm satellite lesion just anterior to the dominant mass. 3. Mass effect but no impending herniation.  No ventriculomegaly. Recommend neurosurgery followup. Study discussed by telephone with Dr. Nancy Fetter (covering for Dr. Maury Dus )on 09/18/2013 at 1402 hours .   Electronically Signed   By: Truman Hayward  [place] Crane M.D.   On: 09/18/2013 14:08    EKG: sinus tachycardia  Assessment/Plan Active Problems:   Left leg weakness   Glioblastoma   1. Left sided weakness and multiple falls probably from the glioblastoma: - admit to telemetry - start pt on IV decadron4mg  every6 hours.  - will add on protonix.  - appreciate neurosurgery recommendations.  - radiation oncology consult on Monday - possible palliative care consult for goc  - PT/OT EVAL    2. Prostate cancer: - outpatient follow up with oncologist   Code Status: full code confirmed tonight.  Family Communication: none at bedside, will call wife in am.  Disposition Plan: pending pt eval  Time spent: 61 min  [facility] Pager [phone]

## 2013-09-19 NOTE — Consult Note (Signed)
Reason for Consult: Left parietal brain tumor Referring Physician: EDP  Jesse Owen is an 80 y.o. male.   HPI:  80 year old male with a history of procedure cancer, melanoma, and renal tumor was found to have a brain tumor on an MRI done yesterday. He presented to the emergency department today with a several week history of progressive difficulty with use of his right arm and leg. He fell once yesterday and once today. He denies visual changes. He denies numbness. No seizures. His wife states he's been somewhat more confused recently. Head CT was also done today.  Past Medical History  Diagnosis Date  . Hypertension   . Hyperlipemia   . TIA (transient ischemic attack)   . Hearing loss     left ear   . Prostate cancer     tx. radiation- greater than 10 yrs  . H/O seasonal allergies     mild hayfever  . Cataracts, bilateral   . UTI (urinary tract infection) 07-13-13    at present to start antibiotic,not yet started or picked up from pharmacy  . Hematuria 07/15/2013  . Brain tumor     Past Surgical History  Procedure Laterality Date  . Melanoma excision    . Cystoscopy with retrograde pyelogram, ureteroscopy and stent placement Left 07/15/2013    Procedure: CYSTOSCOPY WITH BILATERAL RETROGRADE PYELOGRAM, URETEROSCOPY AND LEFT URETERA LDILATION STENT PLACEMENT AND  RENAL MASS BIOPSY;  Surgeon: Molli Hazard, MD;  Location: [facility];  Service: Urology;  Laterality: Left;    No Known Allergies  History  Substance Use Topics  . Smoking status: Never Smoker   . Smokeless tobacco: Never Used  . Alcohol Use: No    Family History  Problem Relation Age of Onset  . Prostate cancer Father     with mets to liver     Review of Systems  Positive ROS: neg  All other systems have been reviewed and were otherwise negative with the exception of those mentioned in the HPI and as above.  Objective: Vital signs in last 24 hours: Temp:  [97.8 F (36.6 C)-98.6 F (37 C)]  98.6 F (37 C) (03/21 1716) Pulse Rate:  [99-103] 99 (03/21 1716) Resp:  [15-19] 15 (03/21 1716) BP: (147-169)/(92-144) 147/92 mmHg (03/21 1716) SpO2:  [96 %-98 %] 98 % (03/21 1716)  General Appearance: Alert, cooperative, no distress, appears stated age Head: Normocephalic, without obvious abnormality, atraumatic Eyes: PERRL, conjunctiva/corneas clear, EOM's intact  Neck: Supple, symmetrical, trachea midline Back: Symmetric, no curvature, ROM normal, no CVA tenderness Lungs:  respirations unlabored Heart: Regular rate and rhythm Abdomen: Soft Extremities: Extremities normal, atraumatic, no cyanosis or edema   NEUROLOGIC:   Mental status: A&O x4, no aphasia, good attention span, Memory and fund of knowledge seems okay Motor Exam - he has pronator drift on the right and ataxia of the right arm and leg, maybe some weakness of the right face Sensory Exam - grossly normal Reflexes: symmetric, no pathologic reflexes, No Hoffman's, No clonus Coordination - poor on the right Gait - not tested  Balance - not tested Cranial Nerves: I: smell Not tested  II: visual acuity  OS: na OD: na  II: visual fields Full to confrontation  II: pupils Equal, round, reactive to light  III,VII: ptosis None  III,IV,VI: extraocular muscles  Full ROM  V: mastication Normal  V: facial light touch sensation  Normal  V,VII: corneal reflex  Present  VII: facial muscle function - upper   may  be some weakness   VII: facial muscle function - lower  may be some weakness   VIII: hearing Not tested  IX: soft palate elevation  Normal  IX,X: gag reflex Present  XI: trapezius strength  5/5  XI: sternocleidomastoid strength 5/5  XI: neck flexion strength  5/5  XII: tongue strength  Normal    Data Review Lab Results  Component Value Date   WBC 9.6 09/19/2013   HGB 12.3* 09/19/2013   HCT 36.2* 09/19/2013   MCV 91.9 09/19/2013   PLT 317 09/19/2013   Lab Results  Component Value Date   NA 141 09/19/2013   K  3.9 09/19/2013   CL 103 09/19/2013   CO2 24 09/19/2013   BUN 17 09/19/2013   CREATININE 1.05 09/19/2013   GLUCOSE 127* 09/19/2013   Lab Results  Component Value Date   INR 0.95 08/10/2013    Radiology: Dg Chest 2 View  09/19/2013   CLINICAL DATA:  Weakness for 3 months, hypertension, prostate cancer  EXAM: CHEST  2 VIEW  COMPARISON:  08/24/2013  FINDINGS: Normal heart size, mediastinal contours, and pulmonary vascularity.  Mild tortuous thoracic aorta.  Lungs clear.  Slightly prominent first costochondral junctions unchanged.  No pleural effusion or pneumothorax.  No acute osseous findings.  IMPRESSION: No acute abnormalities.   Electronically Signed   By: Lavonia Dana M.D.   On: 09/19/2013 15:58   Ct Head Wo Contrast  09/19/2013   CLINICAL DATA:  Involuntary right arm movement for 2 weeks  EXAM: CT HEAD WITHOUT CONTRAST  TECHNIQUE: Contiguous axial images were obtained from the base of the skull through the vertex without intravenous contrast.  COMPARISON:  MR HEAD WO/W CM dated 09/18/2013  FINDINGS: Again noted is an ill defined left posterior parietal lobe mass with severe surrounding vasogenic edema. There is mild left-to-right midline shift measuring 7 mm. There is a 10 mm hyperdense area within the left parietal lobe mass likely representing a small area of hemorrhage. There is no extra-axial hemorrhage. There is no evidence of a cortical-based area of acute infarction. There is periventricular white matter low attenuation likely secondary to microangiopathy.  There is no hydrocephalus.  The basal cisterns are patent.  Visualized portions of the orbits are unremarkable. The visualized portions of the paranasal sinuses and mastoid air cells are unremarkable.  The osseous structures are unremarkable.  IMPRESSION: 1.  No new findings compared with 09/18/2013.  2. Large left parietal lobe mass with a small central area of hyperdensity suggesting hemorrhage as was previously demonstrated on the MRI dated  09/18/2013. There is severe surrounding vasogenic edema.   Electronically Signed   By: Kathreen Devoid   On: 09/19/2013 16:31     Assessment/Plan: 80 year old gentleman with a history of multiple other tumors to presents with a new left dominant hemisphere ring-enhancing mass with a satellite lesion consistent with glioblastoma multiforme versus lymphoma. I doubt this is a metastatic lesion. This is located in eloquent brain near the motor strip and that is why he is weak on the right. I do not believe that surgical intervention would be of benefit here. Indeed, I think there would be a high chance of motor deficit postoperatively. I think that he should consider admission for physical therapy, Decadron, and hospice care. He can consider palliative radiotherapy that may add a few weeks to his longevity. Given the radiologic appearance of this lesion am not sure that biopsy offers much benefit. Whether this is lymphoma or GBM it would  be treated the same with palliative radiation. Again I doubt very seriously this is a metastatic lesion were resolving hematoma or other ring-enhancing lesion. I've explained the prognosis of the lesion to the patient and tried to explain why it is not really surgically accessible. Multifocal GBM has very poor prognosis, especially given his current level of function (some confusion and right hemiparesis). Please let me know if I can be of further assistance in this nice gentleman.   Lui Bellis S 09/19/2013 6:17 PM

## 2013-09-20 LAB — CBC
HCT: 35.5 % — ABNORMAL LOW (ref 39.0–52.0)
Hemoglobin: 12 g/dL — ABNORMAL LOW (ref 13.0–17.0)
MCH: 30.8 pg (ref 26.0–34.0)
MCHC: 33.8 g/dL (ref 30.0–36.0)
MCV: 91 fL (ref 78.0–100.0)
PLATELETS: 317 10*3/uL (ref 150–400)
RBC: 3.9 MIL/uL — ABNORMAL LOW (ref 4.22–5.81)
RDW: 14.3 % (ref 11.5–15.5)
WBC: 5.4 10*3/uL (ref 4.0–10.5)

## 2013-09-20 LAB — BASIC METABOLIC PANEL
BUN: 17 mg/dL (ref 6–23)
CALCIUM: 9.2 mg/dL (ref 8.4–10.5)
CO2: 25 mEq/L (ref 19–32)
CREATININE: 1.07 mg/dL (ref 0.50–1.35)
Chloride: 103 mEq/L (ref 96–112)
GFR calc Af Amer: 73 mL/min — ABNORMAL LOW (ref >90)
GFR calc non Af Amer: 63 mL/min — ABNORMAL LOW (ref >90)
GLUCOSE: 171 mg/dL — AB (ref 70–99)
Potassium: 3.9 mEq/L (ref 3.7–5.3)
SODIUM: 139 meq/L (ref 137–147)

## 2013-09-20 MED ORDER — SODIUM CHLORIDE 0.9 % IJ SOLN
3.0000 mL | INTRAMUSCULAR | Status: DC | PRN
  Administered 2013-09-20 – 2013-09-21 (×2): 3 mL via INTRAVENOUS

## 2013-09-20 NOTE — Progress Notes (Signed)
[facility] PROGRESS NOTE  Jesse Owen HYW:[id number] DOB: 26-Jan-1930 DOA: 09/19/2013 PCP: Vena Austria, MD  Assessment/Plan: Left sided weakness and multiple falls probably from the glioblastoma:  - admitted to telemetry  - started pt on IV decadron4mg  every6 hours.  - will add on protonix.  - appreciate neurosurgery recommendations.  - pt does not want radiation oncology consult or does not want to go through palliative radiation.  - palliative care consulted for goc . Wife and pt decided they wanted to go home, with hospital bed.  - PT/OT EVAL recommended SNF.  2. Prostate cancer:  - outpatient follow up with oncologist  Code Status: full code confirmed tonight.  Family Communication: DISCUSSED in detail with wife at bedside. Disposition Plan: pending pt eval   Consultants:  Palliative care  Oncology Dr Alen Blew will be notified in am.   Neuro surgery consult from Dr Ronnald Ramp.  Procedures:  CT head Antibiotics:  none  HPI/Subjective: Feeling much better than yesterday. No new issues very pleasant and happy.   Objective: Filed Vitals:   09/20/13 0525  BP: 152/76  Pulse: 75  Temp: 97.6 F (36.4 C)  Resp: 18    Intake/Output Summary (Last 24 hours) at 09/20/13 1307 Last data filed at 09/20/13 4854  Gross per 24 hour  Intake   1016 ml  Output    100 ml  Net    916 ml   Filed Weights   09/19/13 2051  Weight: 79 kg (174 lb 2.6 oz)    Exam:   General:  Alert afebrile comfortable  Cardiovascular: s1s2  Respiratory: ctab  Abdomen: soft NT NDB S+  Musculoskeletal: trace edema  Neuro: his thinking more clear. No speech abn. Clumsiness and the pronator drift on the RUE improved   Data Reviewed: Basic Metabolic Panel:  Recent Labs Lab 09/19/13 1554 09/20/13 0421  NA 141 139  K 3.9 3.9  CL 103 103  CO2 24 25  GLUCOSE 127* 171*  BUN 17 17  CREATININE 1.05 1.07  CALCIUM 9.8 9.2   Liver Function Tests:  Recent Labs Lab  09/19/13 1554  AST 20  ALT 14  ALKPHOS 67  BILITOT <0.2*  PROT 6.3  ALBUMIN 2.7*   No results found for this basename: LIPASE, AMYLASE,  in the last 168 hours No results found for this basename: AMMONIA,  in the last 168 hours CBC:  Recent Labs Lab 09/19/13 1554 09/20/13 0421  WBC 9.6 5.4  NEUTROABS 6.1  --   HGB 12.3* 12.0*  HCT 36.2* 35.5*  MCV 91.9 91.0  PLT 317 317   Cardiac Enzymes: No results found for this basename: CKTOTAL, CKMB, CKMBINDEX, TROPONINI,  in the last 168 hours BNP (last 3 results)  Recent Labs  08/05/13 1320 08/24/13 1019  PROBNP 261.9 60.0   CBG: No results found for this basename: GLUCAP,  in the last 168 hours  No results found for this or any previous visit (from the past 240 hour(s)).   Studies: Dg Chest 2 View  09/19/2013   CLINICAL DATA:  Weakness for 3 months, hypertension, prostate cancer  EXAM: CHEST  2 VIEW  COMPARISON:  08/24/2013  FINDINGS: Normal heart size, mediastinal contours, and pulmonary vascularity.  Mild tortuous thoracic aorta.  Lungs clear.  Slightly prominent first costochondral junctions unchanged.  No pleural effusion or pneumothorax.  No acute osseous findings.  IMPRESSION: No acute abnormalities.   Electronically Signed   By: Lavonia Dana M.D.   On: 09/19/2013 15:58  Ct Head Wo Contrast  09/19/2013   CLINICAL DATA:  Involuntary right arm movement for 2 weeks  EXAM: CT HEAD WITHOUT CONTRAST  TECHNIQUE: Contiguous axial images were obtained from the base of the skull through the vertex without intravenous contrast.  COMPARISON:  MR HEAD WO/W CM dated 09/18/2013  FINDINGS: Again noted is an ill defined left posterior parietal lobe mass with severe surrounding vasogenic edema. There is mild left-to-right midline shift measuring 7 mm. There is a 10 mm hyperdense area within the left parietal lobe mass likely representing a small area of hemorrhage. There is no extra-axial hemorrhage. There is no evidence of a cortical-based area  of acute infarction. There is periventricular white matter low attenuation likely secondary to microangiopathy.  There is no hydrocephalus.  The basal cisterns are patent.  Visualized portions of the orbits are unremarkable. The visualized portions of the paranasal sinuses and mastoid air cells are unremarkable.  The osseous structures are unremarkable.  IMPRESSION: 1.  No new findings compared with 09/18/2013.  2. Large left parietal lobe mass with a small central area of hyperdensity suggesting hemorrhage as was previously demonstrated on the MRI dated 09/18/2013. There is severe surrounding vasogenic edema.   Electronically Signed   By: Kathreen Devoid   On: 09/19/2013 16:31   Mr Jeri Cos Wo Contrast  09/18/2013   CLINICAL DATA:  80 year old male with right side weakness x2 months. Possible stroke. Initial encounter.  EXAM: MRI HEAD WITHOUT AND WITH CONTRAST  TECHNIQUE: Multiplanar, multiecho pulse sequences of the brain and surrounding structures were obtained without and with intravenous contrast.  CONTRAST:  5mL MULTIHANCE GADOBENATE DIMEGLUMINE 529 MG/ML IV SOLN  COMPARISON:  None.  FINDINGS: There is a large T2 heterogeneous enhancing mass in the posterior left hemisphere, epicenter air at the confluence of the posterior left frontal and temporal lobes. This appears to be intra-axial. It has thick nodular and peripheral enhancement. The confluent enhancing component of the mass encompasses 52 x 51 x 59 mm (AP by transverse by CC), and the mass extends peripherally to abut and probably involve the lateral dura (series 13, image 14). There are thick curvilinear regions of restricted diffusion within the mass compatible with hypercellularity in this setting (series 400, image 22). There is surrounding T2 and FLAIR hyperintensity extensively involving the left parietal lobe and tracking to the operculum. T2* images demonstrate a mineralized or hemorrhagic component centrally (series 6, image 15).  There is a  small separate satellite enhancing nodule at the inferior frontal operculum (series 13, image 17 and series 12, image 38).  Abnormal T2 and FLAIR signal extends inferiorly to the posterior limb of the external capsule and also toward the midline at the level of the posterior body of the corpus callosum, but no extension across midline is identified (series 11, image 15).  No contralateral or distant enhancing lesion. Superimposed patchy bilateral cerebral white matter T2 and FLAIR hyperintensity. Major intracranial vascular flow voids are preserved. No acute infarct. Mass effect on the left lateral ventricle with no ventriculomegaly. No impending herniation. Basilar cisterns are patent. Negative pituitary, cervicomedullary junction visualized cervical spine. Normal bone marrow signal. Visualized orbit soft tissues are within normal limits. Visualized paranasal sinuses and mastoids are clear.  IMPRESSION: 1. Large posterior left hemisphere enhancing mass most compatible with glioblastoma. CNS lymphoma or grade 3 glioma are the main differential considerations. Solitary brain metastasis is unlikely. 2. Findings include abnormal T2 signal tracking to the left external capsule and toward but not across  the corpus callosum. Small enhancing 8 mm satellite lesion just anterior to the dominant mass. 3. Mass effect but no impending herniation.  No ventriculomegaly. Recommend neurosurgery followup. Study discussed by telephone with Dr. Nancy Fetter (covering for Dr. Maury Dus )on 09/18/2013 at 1402 hours .   Electronically Signed   By: Lars Pinks M.D.   On: 09/18/2013 14:08    Scheduled Meds: . dexamethasone  4 mg Intravenous 4 times per day  . ferrous sulfate  325 mg Oral Q breakfast  . finasteride  5 mg Oral q morning - 10a  . loratadine  10 mg Oral q morning - 10a  . losartan  50 mg Oral Q breakfast  . pantoprazole  40 mg Oral Q0600  . senna-docusate  1 tablet Oral QHS  . sodium chloride  3 mL Intravenous Q12H    Continuous Infusions: . sodium chloride      Active Problems:   Left leg weakness   Glioblastoma    Time spent: 45 min    Jesse Owen  [facility] Pager [phone] If 7PM-7AM, please contact night-coverage at [email or web address], password [facility] 09/20/2013, 1:07 PM  LOS: 1 day

## 2013-09-20 NOTE — Progress Notes (Signed)
Patient Jesse Owen      DOB: 1929-08-05      TQS:[id number]  Met with patient and spouse .  Goals of care scheduled for 830 am Monday morning.   Aseel Uhde L. Lovena Le, MD MBA The Palliative Medicine Team at [facility] Phone: [phone] Pager: [phone]

## 2013-09-20 NOTE — Progress Notes (Signed)
UR completed 

## 2013-09-20 NOTE — Evaluation (Signed)
Physical Therapy Evaluation Patient Details Name: Jesse Owen MRN: [id number] DOB: 1930/04/14 Today's Date: 09/20/2013 Time: 0910-0940 PT Time Calculation (min): 30 min  PT Assessment / Plan / Recommendation History of Present Illness  Jesse Owen is a 80 y.o. male with prior h/o brain cancer, prostate cancer with mets was brought in to the hospital for recurrent falls. He was seen yesterday and underwent MRI brain showed a parietal lobe mass with hemorrhage, probably a glioblastoma. He was sent home. He was brought in for multiple falls since then. He also reports right sided weakness for 2 months.  Clinical Impression  **Pt admitted with falls, R sided weakness, L brain tumor*. Pt/wife state that he has had RUE weakness for several weeks and has involuntary movement of RUE, he developed RLE weakness 09/18/13 and has had 2 falls at home. Pt currently with functional limitations due to the deficits listed below (see PT Problem List).  Pt will benefit from skilled PT to increase their independence and safety with mobility to allow discharge to the venue listed below.    *    PT Assessment  Patient needs continued PT services    Follow Up Recommendations  Supervision/Assistance - 24 hour;Home health PT (pt and wife want to go home, wife states her son and grandson can assist)    Does the patient have the potential to tolerate intense rehabilitation      Barriers to Discharge        Equipment Recommendations  Wheelchair (measurements PT);Hospital bed;Rolling walker with 5" wheels (R platform for RW)    Recommendations for Other Services OT consult   Frequency Min 3X/week    Precautions / Restrictions Precautions Precautions: Fall Restrictions Weight Bearing Restrictions: No   Pertinent Vitals/Pain *HR 95, SaO2 95% on RA 0/10 pain**      Mobility  Bed Mobility Overal bed mobility: Modified Independent Bed Mobility: Supine to Sit Supine to sit: Modified  independent (Device/Increase time);HOB elevated General bed mobility comments: used rail Transfers Overall transfer level: Needs assistance Equipment used: Rolling walker (2 wheeled) Transfers: Sit to/from Stand Sit to Stand: +2 physical assistance;+2 safety/equipment;Min assist General transfer comment: assist to rise/steady, VCs hand placement, pt unable to place RUE on bed due to uncontrollable movement of RUE Ambulation/Gait Ambulation/Gait assistance: +2 physical assistance;+2 safety/equipment Ambulation Distance (Feet): 8 Feet Assistive device: Rolling walker (2 wheeled) Gait Pattern/deviations: Step-to pattern;Decreased step length - left;Decreased step length - right;Shuffle General Gait Details: pt unable to grip RW with RUE, trial of platform RW next PT session, distanc limited by fatigue, RLE weakness, +2 for balance/safety    Exercises     PT Diagnosis: Difficulty walking;Hemiplegia non-dominant side;Abnormality of gait  PT Problem List: Decreased strength;Decreased activity tolerance;Decreased balance;Decreased coordination;Decreased mobility;Decreased knowledge of use of DME PT Treatment Interventions: DME instruction;Gait training;Functional mobility training;Therapeutic exercise;Therapeutic activities;Patient/family education;Wheelchair mobility training;Balance training     PT Goals(Current goals can be found in the care plan section) Acute Rehab PT Goals Patient Stated Goal: to avoid further falls PT Goal Formulation: With patient/family Time For Goal Achievement: 10/04/13 Potential to Achieve Goals: Fair  Visit Information  Last PT Received On: 09/20/13 Assistance Needed: +2 History of Present Illness: Jesse Owen is a 80 y.o. male with prior h/o brain cancer, prostate cancer with mets was brought in to the hospital for recurrent falls. He was seen yesterday and underwent MRI brain showed a parietal lobe mass with hemorrhage, probably a glioblastoma. He was  sent home. He was brought  in for multiple falls since then. He also reports right sided weakness for 2 months.       Prior [place] expects to be discharged to:: Private residence Living Arrangements: Spouse/significant other Available Help at Discharge: Family;Available 24 hours/day Type of Home: House Home Access: Stairs to enter [employer] of Steps: 1 Home Equipment: Walker - 4 wheels;Cane - single point;Bedside commode Additional Comments: 2 recent falls Prior Function Level of Independence: Needs assistance Gait / Transfers Assistance Needed: independent up until 3 weeks ago when RUE/LE weakness started; difficulty walking since 09/18/13 due to RLE weakness, pt had 2 falls at home ADL's / Homemaking Assistance Needed: assist for sponge bathing Communication Communication: HOH Dominant Hand: Left    Cognition  Cognition Arousal/Alertness: Awake/alert Behavior During Therapy: WFL for tasks assessed/performed Overall Cognitive Status: Within Functional Limits for tasks assessed    Extremity/Trunk Assessment Upper Extremity Assessment Upper Extremity Assessment: RUE deficits/detail RUE Deficits / Details: pt has uncontrolled movement of RUE at rest, was unable to grip RW with RUE, Grip strength is +3/5, elbow flexion 4/5, extension -4/5. But poor functional use of RUE.  RUE Coordination: decreased gross motor;decreased fine motor Lower Extremity Assessment Lower Extremity Assessment: RLE deficits/detail RLE Deficits / Details: decreased sensation to light touch R foot, intact R shin and thigh, R ankle DF -4/5, knee ext -4/5, hip flexion 3/5 RLE Sensation: decreased light touch   Balance Balance Overall balance assessment: Needs assistance Sitting-balance support: Feet supported;Bilateral upper extremity supported Sitting balance-Leahy Scale: Fair Standing balance support: Single extremity supported Standing balance-Leahy Scale: Poor   End of Session PT - End of Session Equipment Utilized During Treatment: Gait belt Activity Tolerance: Patient limited by fatigue Patient left: in chair;with call bell/phone within reach;with family/visitor present Nurse Communication: Mobility status  GP     Blondell Reveal Kistler 09/20/2013, 10:53 AM [phone]

## 2013-09-21 DIAGNOSIS — I629 Nontraumatic intracranial hemorrhage, unspecified: Secondary | ICD-10-CM

## 2013-09-21 DIAGNOSIS — Z8546 Personal history of malignant neoplasm of prostate: Secondary | ICD-10-CM

## 2013-09-21 DIAGNOSIS — Z85820 Personal history of malignant melanoma of skin: Secondary | ICD-10-CM

## 2013-09-21 MED ORDER — LORAZEPAM 2 MG/ML IJ SOLN: 0.5000 mg | INTRAMUSCULAR | Status: DC | PRN

## 2013-09-21 MED ORDER — MAGNESIUM HYDROXIDE 400 MG/5ML PO SUSP: 30.0000 mL | Freq: Every day | ORAL | Status: DC | PRN

## 2013-09-21 MED ORDER — POLYETHYLENE GLYCOL 3350 17 G PO PACK
17.0000 g | PACK | Freq: Every day | ORAL | Status: DC
  Administered 2013-09-21 – 2013-09-22 (×2): 17 g via ORAL
  Filled 2013-09-21 (×3): qty 1

## 2013-09-21 MED ORDER — SENNOSIDES-DOCUSATE SODIUM 8.6-50 MG PO TABS
2.0000 | ORAL_TABLET | Freq: Two times a day (BID) | ORAL | Status: DC
  Administered 2013-09-21 – 2013-09-22 (×3): 2 via ORAL
  Filled 2013-09-21 (×6): qty 2

## 2013-09-21 NOTE — Consult Note (Signed)
Reason for Referral: Brain tumor with a history of melanoma and prostate cancer.  HPI: 80 year old gentleman with a history of melanoma diagnosed in January of 2007 and have been on active surveillance since that time. He had a superficial spreading melanoma of the arm about 4 mm of depth with a Clark's level of 4. He had not had any recurrence since that time. He also had a remote history of prostate cancer dating back to your 2000. He was brought in to the hospital on 3/21 for recurrent falls. He was underwent MRI brain showed a parietal lobe mass with hemorrhage, probably a glioblastoma. He was sent home. He was brought in for multiple falls since then. He also reports left sided weakness for 2 months. He denies headache, nausea or vomiting , fever or chills. On arrival he underwent a repeat CT head did not reveal any new changes. He was seen by a neuro surgeon recommended non surgical intervention, like decadron, palliative radiation and hospice care. At some improvement with his clinical status on dexamethasone that today was terribly confused about his deficits have improved slightly. Clinically, he was not complaining of anything particularly. Is not having any pain or discomfort. He was sitting in bed eating his breakfast without any concern. He did report some shakiness in his right arm that is improved with dexamethasone. He has not reported any seizure activities. He was confused most of the night and this was able to recognize me but cannot think of the name.   Past Medical History  Diagnosis Date  . Hypertension   . Hyperlipemia   . TIA (transient ischemic attack)   . Hearing loss     left ear   . Prostate cancer     tx. radiation- greater than 10 yrs  . H/O seasonal allergies     mild hayfever  . Cataracts, bilateral   . UTI (urinary tract infection) 07-13-13    at present to start antibiotic,not yet started or picked up from pharmacy  . Hematuria 07/15/2013  . Brain tumor    :  Past Surgical History  Procedure Laterality Date  . Melanoma excision    . Cystoscopy with retrograde pyelogram, ureteroscopy and stent placement Left 07/15/2013    Procedure: CYSTOSCOPY WITH BILATERAL RETROGRADE PYELOGRAM, URETEROSCOPY AND LEFT URETERA LDILATION STENT PLACEMENT AND  RENAL MASS BIOPSY;  Surgeon: Molli Hazard, MD;  Location: [facility];  Service: Urology;  Laterality: Left;  :   Current Facility-Administered Medications  Medication Dose Route Frequency Provider Last Rate Last Dose  . 0.9 %  sodium chloride infusion   Intravenous Continuous PRN Hosie Poisson, MD      . acetaminophen (TYLENOL) tablet 650 mg  650 mg Oral Q6H PRN Hosie Poisson, MD   650 mg at 09/20/13 2310   Or  . acetaminophen (TYLENOL) suppository 650 mg  650 mg Rectal Q6H PRN Hosie Poisson, MD      . albuterol (PROVENTIL) (2.5 MG/3ML) 0.083% nebulizer solution 3 mL  3 mL Inhalation Q6H PRN Hosie Poisson, MD      . dexamethasone (DECADRON) injection 4 mg  4 mg Intravenous 4 times per day Hosie Poisson, MD   4 mg at 09/21/13 0549  . ferrous sulfate tablet 325 mg  325 mg Oral Q breakfast Hosie Poisson, MD   325 mg at 09/21/13 0759  . finasteride (PROSCAR) tablet 5 mg  5 mg Oral q morning - 10a Hosie Poisson, MD   5 mg at 09/20/13 0921  .  loratadine (CLARITIN) tablet 10 mg  10 mg Oral q morning - 10a Hosie Poisson, MD   10 mg at 09/20/13 0921  . losartan (COZAAR) tablet 50 mg  50 mg Oral Q breakfast Hosie Poisson, MD   50 mg at 09/21/13 0759  . ondansetron (ZOFRAN) tablet 4 mg  4 mg Oral Q6H PRN Hosie Poisson, MD   4 mg at 09/20/13 2310   Or  . ondansetron (ZOFRAN) injection 4 mg  4 mg Intravenous Q6H PRN Hosie Poisson, MD      . pantoprazole (PROTONIX) EC tablet 40 mg  40 mg Oral Q0600 Hosie Poisson, MD   40 mg at 09/21/13 0549  . senna-docusate (Senokot-S) tablet 1 tablet  1 tablet Oral QHS Hosie Poisson, MD   1 tablet at 09/20/13 2012  . sodium chloride 0.9 % injection 3 mL  3 mL Intravenous Q12H Hosie Poisson, MD    3 mL at 09/20/13 2310  . sodium chloride 0.9 % injection 3 mL  3 mL Intravenous PRN Hosie Poisson, MD   3 mL at 09/21/13 0550      No Known Allergies:  Family History  Problem Relation Age of Onset  . Prostate cancer Father     with mets to liver  :  History   Social History  . Marital Status: Married    Spouse Name: N/A    Number of Children: N/A  . Years of Education: N/A   Occupational History  . Not on file.   Social History Main Topics  . Smoking status: Never Smoker   . Smokeless tobacco: Never Used  . Alcohol Use: No  . Drug Use: No  . Sexual Activity: Not Currently   Other Topics Concern  . Not on file   Social History Narrative  . No narrative on file  :  Constitutional: negative for anorexia, chills, fatigue and fevers Eyes: negative for icterus and irritation Ears, nose, mouth, throat, and face: negative for epistaxis and facial trauma Respiratory: negative for dyspnea on exertion, sputum and wheezing Cardiovascular: negative for chest pain, chest pressure/discomfort and exertional chest pressure/discomfort Gastrointestinal: negative for abdominal pain, nausea and vomiting Genitourinary:negative for frequency, hematuria and hesitancy Integument/breast: negative for pruritus, rash and skin color change Hematologic/lymphatic: negative for easy bruising, lymphadenopathy and petechiae Musculoskeletal:negative for back pain, bone pain and muscle weakness Neurological: negative for seizures and vertigo Behavioral/Psych: negative for anxiety and depression Endocrine: negative for temperature intolerance Allergic/Immunologic: negative for urticaria  Exam: Blood pressure 135/68, pulse 78, temperature 97.5 F (36.4 C), temperature source Oral, resp. rate 18, height 5' 7.5" (1.715 m), weight 174 lb 2.6 oz (79 kg), SpO2 98.00%. General appearance: alert Head: Normocephalic, without obvious abnormality, atraumatic Nose: Nares normal. Septum midline. Mucosa  normal. No drainage or sinus tenderness. Throat: lips, mucosa, and tongue normal; teeth and gums normal Resp: clear to auscultation bilaterally Cardio: regular rate and rhythm, S1, S2 normal, no murmur, click, rub or gallop GI: soft, non-tender; bowel sounds normal; no masses,  no organomegaly Extremities: extremities normal, atraumatic, no cyanosis or edema Pulses: 2+ and symmetric Skin: Skin color, texture, turgor normal. No rashes or lesions Lymph nodes: Cervical, supraclavicular, and axillary nodes normal. Neurologic: Mental status: alertness: alert Motor: Decreased motor strength in his right arm but intact in her his right lower extremities.    Recent Labs  09/19/13 1554 09/20/13 0421  WBC 9.6 5.4  HGB 12.3* 12.0*  HCT 36.2* 35.5*  PLT 317 317    Recent Labs  09/19/13 1554 09/20/13 0421  NA 141 139  K 3.9 3.9  CL 103 103  CO2 24 25  GLUCOSE 127* 171*  BUN 17 17  CREATININE 1.05 1.07  CALCIUM 9.8 9.2      Mr Jeri Cos Wo Contrast  09/18/2013   CLINICAL DATA:  80 year old male with right side weakness x2 months. Possible stroke. Initial encounter.  EXAM: MRI HEAD WITHOUT AND WITH CONTRAST  TECHNIQUE: Multiplanar, multiecho pulse sequences of the brain and surrounding structures were obtained without and with intravenous contrast.  CONTRAST:  41mL MULTIHANCE GADOBENATE DIMEGLUMINE 529 MG/ML IV SOLN  COMPARISON:  None.  FINDINGS: There is a large T2 heterogeneous enhancing mass in the posterior left hemisphere, epicenter air at the confluence of the posterior left frontal and temporal lobes. This appears to be intra-axial. It has thick nodular and peripheral enhancement. The confluent enhancing component of the mass encompasses 52 x 51 x 59 mm (AP by transverse by CC), and the mass extends peripherally to abut and probably involve the lateral dura (series 13, image 14). There are thick curvilinear regions of restricted diffusion within the mass compatible with hypercellularity  in this setting (series 400, image 22). There is surrounding T2 and FLAIR hyperintensity extensively involving the left parietal lobe and tracking to the operculum. T2* images demonstrate a mineralized or hemorrhagic component centrally (series 6, image 15).  There is a small separate satellite enhancing nodule at the inferior frontal operculum (series 13, image 17 and series 12, image 38).  Abnormal T2 and FLAIR signal extends inferiorly to the posterior limb of the external capsule and also toward the midline at the level of the posterior body of the corpus callosum, but no extension across midline is identified (series 11, image 15).  No contralateral or distant enhancing lesion. Superimposed patchy bilateral cerebral white matter T2 and FLAIR hyperintensity. Major intracranial vascular flow voids are preserved. No acute infarct. Mass effect on the left lateral ventricle with no ventriculomegaly. No impending herniation. Basilar cisterns are patent. Negative pituitary, cervicomedullary junction visualized cervical spine. Normal bone marrow signal. Visualized orbit soft tissues are within normal limits. Visualized paranasal sinuses and mastoids are clear.  IMPRESSION: 1. Large posterior left hemisphere enhancing mass most compatible with glioblastoma. CNS lymphoma or grade 3 glioma are the main differential considerations. Solitary brain metastasis is unlikely. 2. Findings include abnormal T2 signal tracking to the left external capsule and toward but not across the corpus callosum. Small enhancing 8 mm satellite lesion just anterior to the dominant mass. 3. Mass effect but no impending herniation.  No ventriculomegaly. Recommend neurosurgery followup. Study discussed by telephone with Dr. Nancy Fetter (covering for Dr. Maury Dus )on 09/18/2013 at 1402 hours .   Electronically Signed   By: Lars Pinks M.D.   On: 09/18/2013 14:08    Assessment and Plan:   80 year old gentleman with the following issues:  1. Large  posterior left hemispheric brain tumor found after the patient presented with right-sided weakness, falls and tremors. The differential diagnosis based on the MRI findings including multifocal glioblastoma versus CNS lymphoma. I doubt that this is metastatic melanoma given his remote history and the superficial nature of his melanoma. The appearance of the imaging studies do not suggest metastatic disease at this time. Options of treatments were discussed with Mr. Liou and his wife today and I agree with the overall the prior assessments by Dr. Ronnald Ramp and others. I fear that regardless of the primary tumor his prognosis is poor and I  agree with the current management of supportive care only. I think you'll be reasonable for him to be in a skilled nursing facility with hospice care prior to transition him to home while residential hospice.  2. History of melanoma: I see no clear-cut evidence of recurrence his brain tumor could be melanoma but less likely. I think the treatment would be the same regardless at this point.  3. History of prostate cancer: It is inactive at this point and I see no intervention is needed.  4. Prognosis: His prognosis is poor his brain tumor was limited life expectancy and I agree with a DO NOT RESUSCITATE CODE STATUS and hospice care upon discharge.  Please call with any questions regarding this nice gentleman.

## 2013-09-21 NOTE — Progress Notes (Signed)
[facility] PROGRESS NOTE  Jesse Owen DTO:[id number] DOB: June 21, 1930 DOA: 09/19/2013 PCP: Vena Austria, MD  Assessment/Plan: Left sided weakness and multiple falls probably from the glioblastoma:  - started pt on IV decadron4mg  every6 hours.  - appreciate neurosurgery recommendations.  - pt does not want radiation oncology consult or does not want to go through palliative radiation.  - palliative care consulted for goc . Wife and pt decided they wanted to go home, with hospital bed.  - PT/OT EVAL recommended SNF.  2. Prostate cancer:  - outpatient follow up with oncologist  Code Status: full code confirmed tonight.  Family Communication: DISCUSSED in detail with wife at bedside. Disposition Plan: pending pt eval   Consultants:  Palliative care  Oncology Dr Alen Blew will be notified in am.   Neuro surgery consult from Dr Ronnald Ramp.  Procedures:  CT head Antibiotics:  none  HPI/Subjective: Feeling much better than yesterday. No new issues very pleasant and happy.   Objective: Filed Vitals:   09/21/13 0528  BP: 135/68  Pulse: 78  Temp: 97.5 F (36.4 C)  Resp: 18    Intake/Output Summary (Last 24 hours) at 09/21/13 1342 Last data filed at 09/21/13 9833  Gross per 24 hour  Intake    653 ml  Output   1225 ml  Net   -572 ml   Filed Weights   09/19/13 2051  Weight: 79 kg (174 lb 2.6 oz)    Exam:   General:  Alert afebrile comfortable  Cardiovascular: s1s2  Respiratory: ctab  Abdomen: soft NT NDB S+  Musculoskeletal: trace edema  Neuro: his thinking more clear. No speech abn. Clumsiness and the pronator drift on the RUE improved   Data Reviewed: Basic Metabolic Panel:  Recent Labs Lab 09/19/13 1554 09/20/13 0421  NA 141 139  K 3.9 3.9  CL 103 103  CO2 24 25  GLUCOSE 127* 171*  BUN 17 17  CREATININE 1.05 1.07  CALCIUM 9.8 9.2   Liver Function Tests:  Recent Labs Lab 09/19/13 1554  AST 20  ALT 14  ALKPHOS 67   BILITOT <0.2*  PROT 6.3  ALBUMIN 2.7*   No results found for this basename: LIPASE, AMYLASE,  in the last 168 hours No results found for this basename: AMMONIA,  in the last 168 hours CBC:  Recent Labs Lab 09/19/13 1554 09/20/13 0421  WBC 9.6 5.4  NEUTROABS 6.1  --   HGB 12.3* 12.0*  HCT 36.2* 35.5*  MCV 91.9 91.0  PLT 317 317   Cardiac Enzymes: No results found for this basename: CKTOTAL, CKMB, CKMBINDEX, TROPONINI,  in the last 168 hours BNP (last 3 results)  Recent Labs  08/05/13 1320 08/24/13 1019  PROBNP 261.9 60.0   CBG: No results found for this basename: GLUCAP,  in the last 168 hours  No results found for this or any previous visit (from the past 240 hour(s)).   Studies: Dg Chest 2 View  09/19/2013   CLINICAL DATA:  Weakness for 3 months, hypertension, prostate cancer  EXAM: CHEST  2 VIEW  COMPARISON:  08/24/2013  FINDINGS: Normal heart size, mediastinal contours, and pulmonary vascularity.  Mild tortuous thoracic aorta.  Lungs clear.  Slightly prominent first costochondral junctions unchanged.  No pleural effusion or pneumothorax.  No acute osseous findings.  IMPRESSION: No acute abnormalities.   Electronically Signed   By: Lavonia Dana M.D.   On: 09/19/2013 15:58   Ct Head Wo Contrast  09/19/2013   CLINICAL DATA:  Involuntary right arm movement for 2 weeks  EXAM: CT HEAD WITHOUT CONTRAST  TECHNIQUE: Contiguous axial images were obtained from the base of the skull through the vertex without intravenous contrast.  COMPARISON:  MR HEAD WO/W CM dated 09/18/2013  FINDINGS: Again noted is an ill defined left posterior parietal lobe mass with severe surrounding vasogenic edema. There is mild left-to-right midline shift measuring 7 mm. There is a 10 mm hyperdense area within the left parietal lobe mass likely representing a small area of hemorrhage. There is no extra-axial hemorrhage. There is no evidence of a cortical-based area of acute infarction. There is periventricular  white matter low attenuation likely secondary to microangiopathy.  There is no hydrocephalus.  The basal cisterns are patent.  Visualized portions of the orbits are unremarkable. The visualized portions of the paranasal sinuses and mastoid air cells are unremarkable.  The osseous structures are unremarkable.  IMPRESSION: 1.  No new findings compared with 09/18/2013.  2. Large left parietal lobe mass with a small central area of hyperdensity suggesting hemorrhage as was previously demonstrated on the MRI dated 09/18/2013. There is severe surrounding vasogenic edema.   Electronically Signed   By: Kathreen Devoid   On: 09/19/2013 16:31    Scheduled Meds: . dexamethasone  4 mg Intravenous 4 times per day  . ferrous sulfate  325 mg Oral Q breakfast  . finasteride  5 mg Oral q morning - 10a  . loratadine  10 mg Oral q morning - 10a  . losartan  50 mg Oral Q breakfast  . senna-docusate  1 tablet Oral QHS  . sodium chloride  3 mL Intravenous Q12H   Continuous Infusions: . sodium chloride      Active Problems:   Left leg weakness   Glioblastoma    Time spent: 25 min    Angelis Gates  [facility] Pager [phone] If 7PM-7AM, please contact night-coverage at [email or web address], password [email or web address] 09/21/2013, 1:42 PM  LOS: 2 days

## 2013-09-21 NOTE — Progress Notes (Signed)
CARE MANAGEMENT NOTE 09/21/2013  Patient:  Jesse Owen, Jesse Owen   Account Number:  [id number]  Date Initiated:  09/21/2013  Documentation initiated by:  Dessa Phi  Subjective/Objective Assessment:   80 Y/O M ADMITTED W/L LEG WEAKNESS.KJ:ZPHXTAVW CA.     Action/Plan:   FROM HOME.   Anticipated DC Date:  09/22/2013   Anticipated DC Plan:  [place]  CM consult      Choice offered to / List presented to:  C-1 Patient   DME arranged  HOSPITAL BED      DME agency  [place] arranged  HH-1 RN      [facility] agency  [facility] [place]   Status of service:  In process, will continue to follow Medicare Important Message given?   (If response is "NO", the following Medicare IM given date fields will be blank) Date Medicare IM given:   Date Additional Medicare IM given:    Discharge Disposition:    Per UR Regulation:  Reviewed for med. necessity/level of care/duration of stay  If discussed at [place] of Stay Meetings, dates discussed:    Comments:  09/21/13 Jesse Fristoe RN,BSN NCM [place].TC [facility] AWARE OF REFERRAL & D/C TOMORROW.[place] [facility] REP [place]

## 2013-09-21 NOTE — Progress Notes (Signed)
Physical Therapy Treatment Patient Details Name: Jesse Owen MRN: [id number] DOB: 16-Jun-1930 Today's Date: 09/21/2013 Time: 4098-1191 PT Time Calculation (min): 40 min  PT Assessment / Plan / Recommendation  History of Present Illness Jesse Owen is a 80 y.o. male with prior h/o brain cancer, prostate cancer with mets was brought in to the hospital for recurrent falls. He was seen yesterday and underwent MRI brain showed a parietal lobe mass with hemorrhage, probably a glioblastoma. He was sent home. He was brought in for multiple falls since then. He also reports right sided weakness for 2 months.   PT Comments   Pt progressing this session;  Wife really wants to take him home, she does have some other family support; Will work toward pt being safe with transfers and wife assist;   Follow Up Recommendations  Supervision/Assistance - 24 hour;Home health PT     Does the patient have the potential to tolerate intense rehabilitation     Barriers to Discharge        Equipment Recommendations  Wheelchair (measurements PT);Hospital bed;Rolling walker with 5" wheels    Recommendations for Other Services    Frequency Min 3X/week   Progress towards PT Goals Progress towards PT goals: Progressing toward goals  Plan Current plan remains appropriate    Precautions / Restrictions Precautions Precautions: Fall   Pertinent Vitals/Pain C/o left  Ankle but pt reports it "got better"    Mobility  Bed Mobility Overal bed mobility: Needs Assistance Bed Mobility: Supine to Sit Supine to sit: Supervision General bed mobility comments: for safety, mildly impulsive, pt attempting to sit EOB with rail up Transfers Overall transfer level: Needs assistance Equipment used: Rolling walker (2 wheeled) (stood to chair) Transfers: Sit to/from Stand Sit to Stand: Min guard;Mod assist;Max assist General transfer comment: variable levels of assist, decr assist needed with repeated sit<>stand  trials Ambulation/Gait Ambulation/Gait assistance: +2 physical assistance;Min assist;Mod assist;+2 safety/equipment Ambulation Distance (Feet): 40 Feet (20' more) Assistive device: Rolling walker (2 wheeled) Gait Pattern/deviations: Scissoring;Drifts right/left;Decreased stride length General Gait Details: pt able to maintain grasp on RW with facilitation from PT RUE; due to unctontrolled movements RUE, did not feel it was appropriate to use platform, discussed with wife when she asked about previous PT's suggestions; pt does require assist and multi-modal cues for balance and RW direction    Exercises     PT Diagnosis:    PT Problem List:   PT Treatment Interventions:     PT Goals (current goals can now be found in the care plan section) Acute Rehab PT Goals Patient Stated Goal: to avoid further falls PT Goal Formulation: With patient/family Time For Goal Achievement: 10/04/13 Potential to Achieve Goals: Fair  Visit Information  Last PT Received On: 09/21/13 Assistance Needed: +2 History of Present Illness: Jesse Owen is a 80 y.o. male with prior h/o brain cancer, prostate cancer with mets was brought in to the hospital for recurrent falls. He was seen yesterday and underwent MRI brain showed a parietal lobe mass with hemorrhage, probably a glioblastoma. He was sent home. He was brought in for multiple falls since then. He also reports right sided weakness for 2 months.    Subjective Data  Patient Stated Goal: to avoid further falls   Cognition  Cognition Arousal/Alertness: Awake/alert Behavior During Therapy: WFL for tasks assessed/performed Overall Cognitive Status: Impaired/Different from baseline Area of Impairment: Attention;Safety/judgement;Problem solving Current Attention Level: Sustained Safety/Judgement: Decreased awareness of safety;Decreased awareness of deficits Problem Solving: Requires  verbal cues;Requires tactile cues;Difficulty sequencing General  Comments: pt requires frequent redirection to task    Balance  Balance Overall balance assessment: Needs assistance;History of Falls Sitting-balance support: Feet supported;Feet unsupported;Single extremity supported Sitting balance-Leahy Scale: Fair Sitting balance - Comments: balance activities, pt able to wt shift with close supervision and correct mioline Postural control: Right lateral lean Standing balance support: Bilateral upper extremity supported;During functional activity;No upper extremity supported Standing balance-Leahy Scale: Poor Standing balance comment: worked on static stand with and without UE support, pregait activities  End of Session PT - End of Session Equipment Utilized During Treatment: Gait belt Activity Tolerance: Patient tolerated treatment well Patient left: in chair;with call bell/phone within reach;with family/visitor present Nurse Communication: Mobility status   GP     [facility] [place] 09/21/2013, 2:15 PM

## 2013-09-21 NOTE — Consult Note (Signed)
Patient OF:BPZWCHE SLEVIN GUNBY      DOB: June 11, 1930      NID:[id number]  Summary of Goals of care; full note to follow:  Met with patient and spouse.  Patient intermittently confused but can still express himself with some assistance and patient explanation.  Both understand the terminal nature of his illness. Their first preference would be home with hospice , however, making arrangements might require some time.  It has been suggested that he go to SNF first , but I do not see that as a reasonable option unless they are unable to make the home environment safe .  I have asked the hospice of their choice to help [place] potentially bridge care if home can not occur right away.  They will evaluate him for possible symptoms to manage.  Reviewed MOST for with patient and spouse.  Patient has elected DNR but unsure of other choices on the form and so his wife will review.    Total time  830- 900 and 930 to 10   Discussed with SW and CM  Melvia Matousek L. Lovena Le, MD MBA The Palliative Medicine Team at [facility] Phone: [phone] Pager: [phone]

## 2013-09-21 NOTE — Progress Notes (Signed)
[facility] is providing the following services: Hospital Bed  If patient discharges after hours, please call [phone].   Linward Headland 09/21/2013, 2:54 PM

## 2013-09-22 DIAGNOSIS — D649 Anemia, unspecified: Secondary | ICD-10-CM

## 2013-09-22 MED ORDER — FLEET ENEMA 7-19 GM/118ML RE ENEM: 1.0000 | ENEMA | Freq: Every day | RECTAL | Status: DC | PRN

## 2013-09-22 MED ORDER — FLEET ENEMA 7-19 GM/118ML RE ENEM
1.0000 | ENEMA | Freq: Every day | RECTAL | Status: DC | PRN
  Administered 2013-09-22: 1 via RECTAL
  Filled 2013-09-22: qty 1

## 2013-09-22 NOTE — Consult Note (Signed)
[facility] Met with spouse, Dorian Pod, @ 10:15am, to discuss referral for hospice services in the home. Reviewed hospice philosophy and scope of services that are consistent with patient and family Goals of Care. Dorian Pod states that the patient has had a difficult morning d/t constipation/impaction and that he may not be disharged until tomorrow morning. Medical Records reviewed by Medical Director, and patient approved for services in the home when discharged. MRY ordered from [facility] for delivery today: Fully electric bed with gel overlay and 1/2 rails, OBT, O2 concentrator,  3 in 1, rolling walker. Note: the previously DME order was appended, following referral to [facility]. Thank you for this referral. We will plan to admit the patient in his home when he is discharged. Yetta Glassman, [facility] Liaison [phone])

## 2013-09-22 NOTE — Discharge Summary (Addendum)
Physician Discharge Summary  Jesse Owen QQP:[id number] DOB: 07/16/1929 DOA: 09/19/2013  PCP: Vena Austria, MD  Admit date: 09/19/2013 Discharge date: 09/23/2013  Time spent: 30 minutes  Recommendations for Outpatient Follow-up:  Follow up with hospice service on discharge Follow upwith PCP and oncology as needed.  Discharge Diagnoses:  Active Problems:   Anemia   Hypertension   Left leg weakness   Glioblastoma   Discharge Condition: slightly improved. Goal of care now discomfort being discharged with home hospice.  Diet recommendation: regular  Filed Weights   09/19/13 2051  Weight: 79 kg (174 lb 2.6 oz)    History of present illness:   Jesse Owen is a 80 y.o. male with prior h/o brain cancer, prostate cancer with mets was brought in to the hospital for recurrent falls. He was seen yesterday and underwent MRI brain showed a parietal lobe mass with hemorrhage, probably a glioblastoma. He was sent home. He was brought in for multiple falls since then. He also reports left sided weakness for 2 months. He denies headache, nausea or vomiting , fever or c hills. On arrival he underwent a repeat CT head did not reveal any new changes. He was seen by a neuro surgeon recommended non surgical intervention, like decadron, palliative radiation and hospice care. He is then referred tohospitalist service for management of the above.  Palliative care consulted , family decided for home hospice. He will be discharged in am to  Home with hospice.   Hospital Course:   1. Right sided weakness, confusion and multiple falls probably from the glioblastoma:  started pt on IV decadron4mg  every6 hours from admission will be changed to po decadron on discharge  to twice daily. Neurosurgery consulted on admission, recommended palliative radiation/ conservative measures. patient does not want radiation oncology consult or does not want to go through palliative radiation. palliative  care consulted for goc . Wife and pt decided they wanted to go home, with home hospice, filled out the MOST form and requested hospital bed. Case manager is assisting with the DME'S.    2. Prostate cancer H/O Melanoma:  - outpatient follow up with oncologist AS NEEDED.    3. Constipation: Stool softners, laxatives and prn enemas   4. Anemia: mild and stable.      Procedures:  CT head  Consultations:  Neurosurgery  Oncology  Palliative care consult hospice  Discharge Exam: Filed Vitals:   09/23/13 0625  BP: 170/88  Pulse: 75  Temp:   Resp:     General: Alert afebrile comfortable but confused.  Cardiovascular: s1s2 Respiratory: ctab  Abdomen: soft NT ND BS+  Musculoskeletal: trace edema Neuro: his thinking more clear. No speech abn. Clumsiness and the pronator drift on the RUE improved    Discharge Instructions      Discharge Orders   Future Appointments Provider Department Dept Phone   10/15/2013 10:00 AM [facility] [phone]   10/15/2013 10:30 AM Wyatt Portela, MD [facility] [phone]   Future Orders Complete By Expires   Diet - low sodium heart healthy  As directed    Increase activity slowly  As directed        Medication List    STOP taking these medications       multivitamin with minerals Tabs tablet     OSTEO BI-FLEX JOINT SHIELD PO     simvastatin 40 MG tablet  Commonly known as:  ZOCOR  TAKE these medications       albuterol 108 (90 BASE) MCG/ACT inhaler  Commonly known as:  PROVENTIL HFA;VENTOLIN HFA  Inhale 2 puffs into the lungs every 4 (four) hours.     CALCIUM 600 + D PO  Take 1 tablet by mouth 2 (two) times daily.     dexamethasone 6 MG tablet  Commonly known as:  DECADRON  Take 1 tablet (6 mg total) by mouth 2 (two) times daily.     ferrous sulfate 325 (65 FE) MG tablet  Take 1 tablet (325 mg total) by mouth daily with breakfast.      finasteride 5 MG tablet  Commonly known as:  PROSCAR  Take 5 mg by mouth every morning.     flunisolide 29 MCG/ACT (0.025%) nasal spray  Commonly known as:  NASAREL  Place 2 sprays into the nose daily as needed for rhinitis or allergies. Dose is for each nostril.     hydrocortisone cream 1 %  Apply 1 application topically 2 (two) times daily as needed (Applies to back for itching.).     loratadine 10 MG tablet  Commonly known as:  CLARITIN  Take 10 mg by mouth every morning.     LORazepam 2 MG/ML concentrated solution  Commonly known as:  ATIVAN  Take 0.5 mLs (1 mg total) by mouth every 6 (six) hours as needed for anxiety.     losartan 100 MG tablet  Commonly known as:  COZAAR  Take 0.5 tablets (50 mg total) by mouth daily with breakfast.     morphine CONCENTRATE 10 mg / 0.5 ml concentrated solution  Take 0.5 mLs (10 mg total) by mouth every 3 (three) hours as needed for moderate pain or severe pain.     polyethylene glycol packet  Commonly known as:  MIRALAX / GLYCOLAX  Take 17 g by mouth daily.     polyvinyl alcohol 1.4 % ophthalmic solution  Commonly known as:  LIQUIFILM TEARS  Place 2 drops into both eyes daily as needed for dry eyes.     potassium gluconate 595 MG Tabs tablet  Take 595 mg by mouth every morning.     sennosides-docusate sodium 8.6-50 MG tablet  Commonly known as:  SENOKOT-S  Take 1 tablet by mouth at bedtime.       No Known Allergies    The results of significant diagnostics from this hospitalization (including imaging, microbiology, ancillary and laboratory) are listed below for reference.    Significant Diagnostic Studies: Dg Chest 2 View  09/19/2013   CLINICAL DATA:  Weakness for 3 months, hypertension, prostate cancer  EXAM: CHEST  2 VIEW  COMPARISON:  08/24/2013  FINDINGS: Normal heart size, mediastinal contours, and pulmonary vascularity.  Mild tortuous thoracic aorta.  Lungs clear.  Slightly prominent first costochondral junctions  unchanged.  No pleural effusion or pneumothorax.  No acute osseous findings.  IMPRESSION: No acute abnormalities.   Electronically Signed   By: Lavonia Dana M.D.   On: 09/19/2013 15:58   Dg Chest 2 View  08/24/2013   CLINICAL DATA:  Shortness of breath.  EXAM: CHEST  2 VIEW  COMPARISON:  CT ANGIO CHEST W/CM &/OR WO/CM dated 08/05/2013; DG CHEST 2 VIEW dated 08/05/2013  FINDINGS: Mediastinum and hilar structures are normal. Right base pulmonary infiltrate noted suggesting pneumonia. No pleural effusion or pneumothorax. Heart size and pulmonary vascularity normal. Degenerative changes thoracic spine. No acute osseous abnormality.  IMPRESSION: Mild right base infiltrate suggesting pneumonia.   Electronically Signed  By: [place]   On: 08/24/2013 12:01   Ct Head Wo Contrast  09/19/2013   CLINICAL DATA:  Involuntary right arm movement for 2 weeks  EXAM: CT HEAD WITHOUT CONTRAST  TECHNIQUE: Contiguous axial images were obtained from the base of the skull through the vertex without intravenous contrast.  COMPARISON:  MR HEAD WO/W CM dated 09/18/2013  FINDINGS: Again noted is an ill defined left posterior parietal lobe mass with severe surrounding vasogenic edema. There is mild left-to-right midline shift measuring 7 mm. There is a 10 mm hyperdense area within the left parietal lobe mass likely representing a small area of hemorrhage. There is no extra-axial hemorrhage. There is no evidence of a cortical-based area of acute infarction. There is periventricular white matter low attenuation likely secondary to microangiopathy.  There is no hydrocephalus.  The basal cisterns are patent.  Visualized portions of the orbits are unremarkable. The visualized portions of the paranasal sinuses and mastoid air cells are unremarkable.  The osseous structures are unremarkable.  IMPRESSION: 1.  No new findings compared with 09/18/2013.  2. Large left parietal lobe mass with a small central area of hyperdensity suggesting  hemorrhage as was previously demonstrated on the MRI dated 09/18/2013. There is severe surrounding vasogenic edema.   Electronically Signed   By: Kathreen Devoid   On: 09/19/2013 16:31   Mr Jeri Cos Wo Contrast  09/18/2013   CLINICAL DATA:  80 year old male with right side weakness x2 months. Possible stroke. Initial encounter.  EXAM: MRI HEAD WITHOUT AND WITH CONTRAST  TECHNIQUE: Multiplanar, multiecho pulse sequences of the brain and surrounding structures were obtained without and with intravenous contrast.  CONTRAST:  36mL MULTIHANCE GADOBENATE DIMEGLUMINE 529 MG/ML IV SOLN  COMPARISON:  None.  FINDINGS: There is a large T2 heterogeneous enhancing mass in the posterior left hemisphere, epicenter air at the confluence of the posterior left frontal and temporal lobes. This appears to be intra-axial. It has thick nodular and peripheral enhancement. The confluent enhancing component of the mass encompasses 52 x 51 x 59 mm (AP by transverse by CC), and the mass extends peripherally to abut and probably involve the lateral dura (series 13, image 14). There are thick curvilinear regions of restricted diffusion within the mass compatible with hypercellularity in this setting (series 400, image 22). There is surrounding T2 and FLAIR hyperintensity extensively involving the left parietal lobe and tracking to the operculum. T2* images demonstrate a mineralized or hemorrhagic component centrally (series 6, image 15).  There is a small separate satellite enhancing nodule at the inferior frontal operculum (series 13, image 17 and series 12, image 38).  Abnormal T2 and FLAIR signal extends inferiorly to the posterior limb of the external capsule and also toward the midline at the level of the posterior body of the corpus callosum, but no extension across midline is identified (series 11, image 15).  No contralateral or distant enhancing lesion. Superimposed patchy bilateral cerebral white matter T2 and FLAIR hyperintensity.  Major intracranial vascular flow voids are preserved. No acute infarct. Mass effect on the left lateral ventricle with no ventriculomegaly. No impending herniation. Basilar cisterns are patent. Negative pituitary, cervicomedullary junction visualized cervical spine. Normal bone marrow signal. Visualized orbit soft tissues are within normal limits. Visualized paranasal sinuses and mastoids are clear.  IMPRESSION: 1. Large posterior left hemisphere enhancing mass most compatible with glioblastoma. CNS lymphoma or grade 3 glioma are the main differential considerations. Solitary brain metastasis is unlikely. 2. Findings include abnormal T2 signal  tracking to the left external capsule and toward but not across the corpus callosum. Small enhancing 8 mm satellite lesion just anterior to the dominant mass. 3. Mass effect but no impending herniation.  No ventriculomegaly. Recommend neurosurgery followup. Study discussed by telephone with Dr. Nancy Fetter (covering for Dr. Maury Dus )on 09/18/2013 at 1402 hours .   Electronically Signed   By: Lars Pinks M.D.   On: 09/18/2013 14:08    Microbiology: No results found for this or any previous visit (from the past 240 hour(s)).   Labs: Basic Metabolic Panel:  Recent Labs Lab 09/19/13 1554 09/20/13 0421  NA 141 139  K 3.9 3.9  CL 103 103  CO2 24 25  GLUCOSE 127* 171*  BUN 17 17  CREATININE 1.05 1.07  CALCIUM 9.8 9.2   Liver Function Tests:  Recent Labs Lab 09/19/13 1554  AST 20  ALT 14  ALKPHOS 67  BILITOT <0.2*  PROT 6.3  ALBUMIN 2.7*   No results found for this basename: LIPASE, AMYLASE,  in the last 168 hours No results found for this basename: AMMONIA,  in the last 168 hours CBC:  Recent Labs Lab 09/19/13 1554 09/20/13 0421  WBC 9.6 5.4  NEUTROABS 6.1  --   HGB 12.3* 12.0*  HCT 36.2* 35.5*  MCV 91.9 91.0  PLT 317 317   Cardiac Enzymes: No results found for this basename: CKTOTAL, CKMB, CKMBINDEX, TROPONINI,  in the last 168  hours BNP: BNP (last 3 results)  Recent Labs  08/05/13 1320 08/24/13 1019  PROBNP 261.9 60.0   CBG: No results found for this basename: GLUCAP,  in the last 168 hours     Signed:  SINGH,PRASHANT K  [facility] 09/23/2013, 9:34 AM

## 2013-09-22 NOTE — Care Management Note (Addendum)
    Page 1 of 2   09/23/2013     11:01:21 AM   CARE MANAGEMENT NOTE 09/23/2013  Patient:  Jesse Owen, Jesse Owen   Account Number:  [id number]  Date Initiated:  09/21/2013  Documentation initiated by:  Dessa Phi  Subjective/Objective Assessment:   80 Y/O M ADMITTED W/L LEG WEAKNESS.RS:WNIOEVOJ CA.     Action/Plan:   FROM HOME.   Anticipated DC Date:  09/23/2013   Anticipated DC Plan:  [place]  CM consult      Choice offered to / List presented to:  C-1 Patient   DME arranged  HOSPITAL BED      DME agency  [place] arranged  HH-1 RN      Status of service:  Completed, signed off Medicare Important Message given?   (If response is "NO", the following Medicare IM given date fields will be blank) Date Medicare IM given:   Date Additional Medicare IM given:    Discharge Disposition:  [place]  Per UR Regulation:  Reviewed for med. necessity/level of care/duration of stay  If discussed at [place] of Stay Meetings, dates discussed:    Comments:  09/23/13 Vinette Crites RN,BSN [address] [place].PATIENT INITIALLY WANTED [facility] BUT CALLED IT [facility],THEN SPOUSE SAID [facility]. I CONTACTED [facility] BY MISCOMMUNICATION SINCE SPOUSE SAID [facility] SO REFERRAL WENT TO [facility] MARGIE & INFORMED MARGIE OF MISCOMMUNICATION THAT SHE PUT IN A CANCELLATION OF REFERRAL IMMEDIATELY. D/C TODAY HOME W/[facility] AWARE OF D/C.  09/22/13 Fidelis Loth RN,BSN NCM [phone] PLEASE CONTACT [facility] WHEN PATIENT LEAVING VIA AMBULANCE,NEED OUT OF FACILITY DNR @ D/C. CLARIFICATION OF HOME HOSPICE CHOICE-PATIENT/SPOUSE CHOSE [facility]-TC DIANA STEVENS [phone],CONFIRMED THEY CAN TAKE  PATIENT.[facility] BED-DME [place].WILL NEED OUT OF FACILITY DNR.WILL NEED AMBULANCE TRANSP @ D/C.TC [facility] MARGIE-AWARE OF CANCELLING REFERRAL.  09/21/13 Ranessa Kosta RN,BSN NCM [place] [facility] AWARE OF REFERRAL & D/C TOMORROW.[place] DME REP [place] BED,& FOLLOWING.SPOUSE CONTACT PERSON ELLEN C#[phone].PCP-DR. READE.AMBULANCE TRANSP NEEDED.

## 2013-09-23 MED ORDER — LORAZEPAM 2 MG/ML PO CONC: 1.0000 mg | Freq: Four times a day (QID) | ORAL | Status: AC | PRN

## 2013-09-23 MED ORDER — DEXAMETHASONE 6 MG PO TABS: 6.0000 mg | ORAL_TABLET | Freq: Two times a day (BID) | ORAL | Status: AC

## 2013-09-23 MED ORDER — MORPHINE SULFATE (CONCENTRATE) 10 MG /0.5 ML PO SOLN
10.0000 mg | ORAL | Status: AC | PRN
Start: 2013-09-23 — End: ?

## 2013-09-23 MED ORDER — POLYETHYLENE GLYCOL 3350 17 G PO PACK: 17.0000 g | PACK | Freq: Every day | ORAL | Status: AC

## 2013-09-23 NOTE — Progress Notes (Signed)
I have reviewed this note and agree with all findings. Kati Keeon Zurn, PT, DPT Pager: [phone]   

## 2013-09-23 NOTE — Progress Notes (Signed)
Physical Therapy Treatment Patient Details Name: Jesse Owen MRN: [id number] DOB: [birth year] Today's Date: 09/23/2013    History of Present Illness Jesse Owen is a 80 y.o. male with prior h/o brain cancer, prostate cancer with mets was brought in to the hospital for recurrent falls. He was seen yesterday and underwent MRI brain showed a parietal lobe mass with hemorrhage, probably a glioblastoma. He was sent home. He was brought in for multiple falls since then. He also reports right sided weakness for 2 months.    PT Comments    Pt practiced pivot transfer from EOB to bedside commode four times (two with PT assisting, two with wife assisting and PT supervision and verbal cues).  Pt and spouse educated on safe home set up for transfers as well as safety during transfers.  Pt performed LE exercises and spouse educated on form and frequency for exercises at home.  Follow Up Recommendations  Supervision/Assistance - 24 hour;Home health PT     Equipment Recommendations  Wheelchair (measurements PT);Hospital bed;Rolling walker with 5" wheels    Recommendations for Other Services       Precautions / Restrictions Precautions Precautions: Fall Restrictions Weight Bearing Restrictions: No    Mobility  Bed Mobility Overal bed mobility: Needs Assistance Bed Mobility: Supine to Sit     Supine to sit: Supervision     General bed mobility comments: verbal cues for safety, used hand rail to help bring trunk into sitting  Transfers Overall transfer level: Needs assistance Equipment used: None (did not use rolling walker because pt unable to grip with RUE) Transfers: Sit to/from [employer] Transfers (pivot transfer x4 to bedside commode) Sit to Stand: Min guard;Mod assist Stand pivot transfers: Min guard;Mod assist       General transfer comment: verbal cues for safety and sequence; pt used L hand on bed rail to push up, repeated cuing to release LUE support  upon standing; variable levels of assist, decr assist needed with repeated sit<>stand trials; two transfers performed with PT assitance, two transfers performed with wife and PT supervision and verbal cues for safety, sequence  Ambulation/Gait             General Gait Details: discussed possible ambulation with pt wife, she reported she does not plan to ambulate pt around home frequently, plans to have a chair and bedside commode in bedroom, more concerned with transfers; therefore, gait training not performed   Stairs            Wheelchair Mobility    Modified Rankin (Stroke Patients Only)       Balance Overall balance assessment: Needs assistance;History of Falls Sitting-balance support: Feet supported;Single extremity supported Sitting balance-Leahy Scale: Fair Sitting balance - Comments: pt performed LE exercises while sitting without support, pt maintained balance but leaned posteriorly Postural control: Posterior lean Standing balance support: No upper extremity supported Standing balance-Leahy Scale: Poor Standing balance comment: pt required guarding while standing and transfers                    Cognition Arousal/Alertness: Awake/alert Behavior During Therapy: WFL for tasks assessed/performed Overall Cognitive Status: Impaired/Different from baseline Area of Impairment: Attention;Safety/judgement;Problem solving   Current Attention Level: Sustained     Safety/Judgement: Decreased awareness of safety;Decreased awareness of deficits   Problem Solving: Requires verbal cues;Requires tactile cues;Difficulty sequencing General Comments: pt requires repeated verbal directions for task    Exercises General Exercises - Lower Extremity Ankle Circles/Pumps: AROM;Both;5 reps Long Arc  Quad: AROM;Both;5 reps Straight Leg Raises: AROM;Seated;Both;5 reps    General Comments        Pertinent Vitals/Pain Activity to tolerance.  No complaints of pain.     Home Living                      Prior Function            PT Goals (current goals can now be found in the care plan section) Acute Rehab PT Goals Patient Stated Goal: to avoid further falls PT Goal Formulation: With patient/family Time For Goal Achievement: 10/04/13 Potential to Achieve Goals: Fair Progress towards PT goals: Progressing toward goals    Frequency  Min 3X/week    PT Plan Current plan remains appropriate    End of Session Equipment Utilized During Treatment: Gait belt Activity Tolerance: Patient tolerated treatment well Patient left: with call bell/phone within reach;with family/visitor present;in bed     Time: 7654-6503 PT Time Calculation (min): 29 min  Charges:  $Therapeutic Activity: 23-37 mins                    G Codes:      Jacqulyn Cane 09/28/2013, 2:10 PM Jacqulyn Cane SPT 09/28/2013

## 2013-09-24 ENCOUNTER — Ambulatory Visit: Payer: Medicare Other | Admitting: Internal Medicine

## 2013-10-15 ENCOUNTER — Other Ambulatory Visit: Payer: Medicare Other

## 2013-10-15 ENCOUNTER — Ambulatory Visit: Payer: Medicare Other | Admitting: Oncology

## 2013-10-16 ENCOUNTER — Ambulatory Visit: Payer: Medicare Other | Admitting: Neurology

## 2013-11-30 DEATH — deceased

## 2014-06-10 ENCOUNTER — Encounter (HOSPITAL_COMMUNITY): Payer: Self-pay | Admitting: Cardiovascular Disease

## 2014-12-27 ENCOUNTER — Other Ambulatory Visit: Payer: Self-pay
